# Patient Record
Sex: Male | Born: 1960 | Hispanic: No | Smoking: Former smoker
Health system: Southern US, Community
[De-identification: ages and names within clinical notes are randomized; demographics above are authoritative.]

## PROBLEM LIST (undated history)

## (undated) DIAGNOSIS — C61 Malignant neoplasm of prostate: Secondary | ICD-10-CM

## (undated) DIAGNOSIS — G4730 Sleep apnea, unspecified: Secondary | ICD-10-CM

## (undated) DIAGNOSIS — I10 Essential (primary) hypertension: Secondary | ICD-10-CM

## (undated) DIAGNOSIS — C4490 Unspecified malignant neoplasm of skin, unspecified: Secondary | ICD-10-CM

## (undated) HISTORY — PX: OTHER SURGICAL HISTORY: SHX169

## (undated) HISTORY — PX: PROSTATE BIOPSY: SHX241

## (undated) HISTORY — PX: TONSILLECTOMY: SUR1361

## (undated) HISTORY — DX: Sleep apnea, unspecified: G47.30

---

## 2002-03-29 ENCOUNTER — Encounter: Admission: RE | Admit: 2002-03-29 | Discharge: 2002-03-29 | Payer: Self-pay | Admitting: Otolaryngology

## 2002-03-29 ENCOUNTER — Encounter: Payer: Self-pay | Admitting: Otolaryngology

## 2003-03-07 ENCOUNTER — Ambulatory Visit (HOSPITAL_BASED_OUTPATIENT_CLINIC_OR_DEPARTMENT_OTHER): Admission: RE | Admit: 2003-03-07 | Discharge: 2003-03-07 | Payer: Self-pay | Admitting: Pulmonary Disease

## 2007-01-29 ENCOUNTER — Ambulatory Visit: Payer: Self-pay | Admitting: Pulmonary Disease

## 2011-04-12 ENCOUNTER — Telehealth: Payer: Self-pay | Admitting: Pulmonary Disease

## 2011-04-12 NOTE — Telephone Encounter (Signed)
I spoke with the pt and he states he needs a rx for a new cpap machine. I advised the pt since it has been so long since he was seen he would need an appt to get RX. Pt set for appt on 05-08-11 at 4:00. Carron Curie, CMA

## 2011-05-08 ENCOUNTER — Encounter: Payer: Self-pay | Admitting: Pulmonary Disease

## 2011-05-08 ENCOUNTER — Ambulatory Visit (INDEPENDENT_AMBULATORY_CARE_PROVIDER_SITE_OTHER): Payer: BC Managed Care – PPO | Admitting: Pulmonary Disease

## 2011-05-08 VITALS — BP 152/80 | HR 66 | Temp 98.1°F | Ht 74.0 in | Wt 220.6 lb

## 2011-05-08 DIAGNOSIS — G4733 Obstructive sleep apnea (adult) (pediatric): Secondary | ICD-10-CM | POA: Insufficient documentation

## 2011-05-08 NOTE — Progress Notes (Signed)
  Subjective:    Patient ID: Benjamin Fletcher, male    DOB: 04-11-1961, 50 y.o.   MRN: [id number]  HPI The patient is a 50 year old male who comes in today to reestablish for management of his known sleep disordered breathing.  He has a diagnosis of moderate sleep apnea, but has not been seen since 2008.  He is been wearing CPAP on a compliant basis, and tells me that it is on the automatic mode.  He is using a full face mask as well as a heat humidifier.  Patient states that he is sleeping very well through the night, and feels rested in the mornings upon arising.  No one has commented on breakthrough snoring.  He feels that he is adequate alertness or in the day, and no sleepiness with driving.  His weight is neutral since his last visit.  His main desire for coming in today is to get a prescription for new mask and supplies.  Review of Systems  Constitutional: Negative.  Negative for fever and unexpected weight change.  HENT: Negative.  Negative for ear pain, nosebleeds, congestion, sore throat, rhinorrhea, sneezing, trouble swallowing, dental problem, postnasal drip and sinus pressure.   Eyes: Negative.  Negative for redness and itching.  Respiratory: Negative.  Negative for cough, chest tightness, shortness of breath and wheezing.   Cardiovascular: Negative.  Negative for palpitations and leg swelling.  Gastrointestinal: Negative.  Negative for nausea and vomiting.  Genitourinary: Negative.  Negative for dysuria.  Musculoskeletal: Negative.  Negative for joint swelling.  Skin: Negative.  Negative for rash.  Neurological: Negative.  Negative for headaches.  Hematological: Negative.  Does not bruise/bleed easily.  Psychiatric/Behavioral: Negative.  Negative for dysphoric mood. The patient is not nervous/anxious.        Objective:   Physical Exam Overweight male in no acute distress Nares patent without discharge.  No skin breakdown or pressure necrosis from CPAP mask Chest totally clear  to auscultation Cardiac with regular rate and rhythm Lower extremities without edema, no cyanosis noted Alert and oriented, does not appear sleepy, moves all 4 extremities.       Assessment & Plan:

## 2011-05-08 NOTE — Patient Instructions (Signed)
Will get you a prescription for new mask Work on modest weight loss followup with me in one year.

## 2011-05-09 ENCOUNTER — Encounter: Payer: Self-pay | Admitting: Pulmonary Disease

## 2011-05-12 ENCOUNTER — Encounter: Payer: Self-pay | Admitting: Pulmonary Disease

## 2011-05-12 NOTE — Assessment & Plan Note (Signed)
The patient has known moderate obstructive sleep apnea which appears to be well controlled on his current CPAP.  The patient needs new supplies and mask, have also encouraged him to work aggressively on weight loss.  Have also stressed to him the importance of yearly followup with a sleep professional.  Will send an order to his medical equipment company to arrange for new supplies and a mask.  He will followup with me again in one year.

## 2015-12-21 ENCOUNTER — Ambulatory Visit (INDEPENDENT_AMBULATORY_CARE_PROVIDER_SITE_OTHER): Payer: BLUE CROSS/BLUE SHIELD | Admitting: Family Medicine

## 2015-12-21 VITALS — BP 108/108 | HR 69 | Temp 98.8°F | Resp 16 | Ht 75.0 in | Wt 244.0 lb

## 2015-12-21 DIAGNOSIS — I10 Essential (primary) hypertension: Secondary | ICD-10-CM | POA: Diagnosis not present

## 2015-12-21 DIAGNOSIS — Z0000 Encounter for general adult medical examination without abnormal findings: Secondary | ICD-10-CM

## 2015-12-21 LAB — POCT CBC
Granulocyte percent: 75.7 %G (ref 37–80)
HCT, POC: 47.9 % (ref 43.5–53.7)
Hemoglobin: 17.4 g/dL (ref 14.1–18.1)
Lymph, poc: 1.8 (ref 0.6–3.4)
MCH, POC: 33.7 pg — AB (ref 27–31.2)
MCHC: 36.3 g/dL — AB (ref 31.8–35.4)
MCV: 92.8 fL (ref 80–97)
MID (cbc): 0.6 (ref 0–0.9)
MPV: 8.2 fL (ref 0–99.8)
POC Granulocyte: 7.4 — AB (ref 2–6.9)
POC LYMPH PERCENT: 18 %L (ref 10–50)
POC MID %: 6.3 %M (ref 0–12)
Platelet Count, POC: 168 10*3/uL (ref 142–424)
RBC: 5.16 M/uL (ref 4.69–6.13)
RDW, POC: 12.8 %
WBC: 9.8 10*3/uL (ref 4.6–10.2)

## 2015-12-21 LAB — COMPREHENSIVE METABOLIC PANEL
ALT: 47 U/L — ABNORMAL HIGH (ref 9–46)
AST: 14 U/L (ref 10–35)
Albumin: 4.4 g/dL (ref 3.6–5.1)
Alkaline Phosphatase: 65 U/L (ref 40–115)
BUN: 8 mg/dL (ref 7–25)
CO2: 24 mmol/L (ref 20–31)
Calcium: 9.5 mg/dL (ref 8.6–10.3)
Chloride: 105 mmol/L (ref 98–110)
Creat: 0.97 mg/dL (ref 0.70–1.33)
Glucose, Bld: 105 mg/dL — ABNORMAL HIGH (ref 65–99)
Potassium: 4 mmol/L (ref 3.5–5.3)
Sodium: 142 mmol/L (ref 135–146)
Total Bilirubin: 0.9 mg/dL (ref 0.2–1.2)
Total Protein: 6.8 g/dL (ref 6.1–8.1)

## 2015-12-21 LAB — LIPID PANEL
Cholesterol: 234 mg/dL — ABNORMAL HIGH (ref 125–200)
HDL: 50 mg/dL (ref >=40)
LDL Cholesterol: 138 mg/dL — ABNORMAL HIGH (ref <130)
Total CHOL/HDL Ratio: 4.7 Ratio (ref <=5.0)
Triglycerides: 230 mg/dL — ABNORMAL HIGH (ref <150)
VLDL: 46 mg/dL — ABNORMAL HIGH (ref <30)

## 2015-12-21 LAB — POCT URINALYSIS DIP (MANUAL ENTRY)
Bilirubin, UA: NEGATIVE
Blood, UA: NEGATIVE
Glucose, UA: NEGATIVE
Ketones, POC UA: NEGATIVE
Leukocytes, UA: NEGATIVE
Nitrite, UA: NEGATIVE
Protein Ur, POC: NEGATIVE
Spec Grav, UA: 1.015
Urobilinogen, UA: 0.2
pH, UA: 7

## 2015-12-21 MED ORDER — LOSARTAN POTASSIUM 100 MG PO TABS: 100.0000 mg | ORAL_TABLET | Freq: Every day | ORAL | Status: DC

## 2015-12-21 NOTE — Patient Instructions (Addendum)

## 2015-12-21 NOTE — Progress Notes (Signed)
Patient ID: Benjamin Fletcher MRN: [id number], DOB: [birth year] 55 y.o. Date of Encounter: 12/21/2015, 1:54 PM  Primary Physician: No primary care provider on file.  Chief Complaint: Physical (CPE)  HPI: 55 y.o. y/o male with history noted below here for CPE.  Doing well. No issues/complaints.  Uses CPAP machine regularly H/O facial skin cancers removed Had eyes checked 54 months ago Will be seeing Urologist next week  His brother has renal cell and prostate ca.  Another brother has diabetes.  Review of Systems: Consitutional: No fever, chills, fatigue, night sweats, lymphadenopathy, or weight changes. Eyes: No visual changes, eye redness, or discharge. ENT/Mouth: Ears: No otalgia, tinnitus, hearing loss, discharge. Nose: No congestion, rhinorrhea, sinus pain, or epistaxis. Throat: No sore throat, post nasal drip, or teeth pain. Cardiovascular: No CP, palpitations, diaphoresis, DOE, edema, orthopnea, PND. Respiratory: No cough, hemoptysis, SOB, or wheezing. Gastrointestinal: No anorexia, dysphagia, reflux, pain, nausea, vomiting, hematemesis, diarrhea, constipation, BRBPR, or melena. Genitourinary: No dysuria, frequency, urgency, hematuria, incontinence, nocturia, decreased urinary stream, discharge, impotence, or testicular pain/masses. Musculoskeletal: No decreased ROM, myalgias, stiffness, joint swelling, or weakness. Skin: No rash, erythema, lesion changes, pain, warmth, jaundice, or pruritis. Neurological: No headache, dizziness, syncope, seizures, tremors, memory loss, coordination problems, or paresthesias. Psychological: No anxiety, depression, hallucinations, SI/HI. Endocrine: No fatigue, polydipsia, polyphagia, polyuria, or known diabetes. All other systems were reviewed and are otherwise negative.  Past Medical History  Diagnosis Date  . Sleep apnea      History reviewed. No pertinent past surgical history.  Home Meds:  Prior to Admission medications   Not on File      Allergies: No Known Allergies  Social History   Social History  . Marital Status: Single    Spouse Name: N/A  . Number of Children: N/A  . Years of Education: N/A   Occupational History  . HVAC    Social History Main Topics  . Smoking status: Never Smoker   . Smokeless tobacco: Not on file  . Alcohol Use: Yes     Comment: weekends only  . Drug Use: No  . Sexual Activity: Not on file   Other Topics Concern  . Not on file   Social History Narrative    Family History  Problem Relation Age of Onset  . Heart disease Father     CABG    Physical Exam: Blood pressure 108/108, pulse 69, temperature 98.8 F (37.1 C), resp. rate 16, height 6\' 3"  (1.905 m), weight 244 lb (110.678 kg), SpO2 97 %.  BP Readings from Last 3 Encounters:  12/21/15 108/108  05/08/11 152/80  BP recheck 136/100 General: Well developed, well nourished, in no acute distress. HEENT: Normocephalic, atraumatic. Conjunctiva pink, sclera non-icteric. Pupils 2 mm constricting to 1 mm, round, regular, and equally reactive to light and accomodation. EOMI.  Fundi benign   Internal auditory canal clear. TMs with good cone of light and without pathology. Nasal mucosa pink. Nares are without discharge. No sinus tenderness. Oral mucosa pink. Dentition excellent.  He has a Pharmacist, community. Pharynx without exudate.    Neck: Supple. Trachea midline. No thyromegaly. Full ROM. No lymphadenopathy. Lungs: Clear to auscultation bilaterally without wheezes, rales, or rhonchi. Breathing is of normal effort and unlabored. Cardiovascular: RRR with S1 S2. No murmurs, rubs, or gallops appreciated. Distal pulses 2+ symmetrically. No carotid or abdominal bruits Abdomen: Soft, non-tender, non-distended with normoactive bowel sounds. No hepatosplenomegaly or masses. No rebound/guarding. No CVA tenderness. Without hernias.  Musculoskeletal: Full range of motion and  5/5 strength throughout. Without swelling, atrophy, tenderness, crepitus, or  warmth. Extremities without clubbing, cyanosis, or edema. Calves supple. Skin: Warm and moist without erythema, ecchymosis, wounds, or rash. Neuro: A+Ox3. CN II-XII grossly intact. Moves all extremities spontaneously. Full sensation throughout. Normal gait. DTR 2+ throughout upper and lower extremities. Finger to nose intact. Psych:  Responds to questions appropriately with a normal affect.    Assessment/Plan:  55 y.o. y/o  male here for CPE   ICD-9-CM ICD-10-CM   1. Annual physical exam V70.0 Z00.00 POCT CBC     POCT urinalysis dipstick     Comprehensive metabolic panel     Lipid panel     EKG 12-Lead     Ambulatory referral to Gastroenterology  2. Essential hypertension 401.9 I10 losartan (COZAAR) 100 MG tablet    Signed, Robyn Haber, MD 12/21/2015 1:54 PM

## 2016-03-04 ENCOUNTER — Encounter: Payer: Self-pay | Admitting: Radiation Oncology

## 2016-03-04 ENCOUNTER — Ambulatory Visit
Admission: RE | Admit: 2016-03-04 | Discharge: 2016-03-04 | Disposition: A | Discharge status: Home / Self Care | Payer: BLUE CROSS/BLUE SHIELD | Source: Ambulatory Visit | Attending: Radiation Oncology | Admitting: Radiation Oncology

## 2016-03-04 VITALS — BP 169/88 | HR 71 | Resp 16 | Ht 74.0 in | Wt 248.2 lb

## 2016-03-04 DIAGNOSIS — C61 Malignant neoplasm of prostate: Secondary | ICD-10-CM | POA: Insufficient documentation

## 2016-03-04 HISTORY — DX: Malignant neoplasm of prostate: C61

## 2016-03-04 HISTORY — DX: Essential (primary) hypertension: I10

## 2016-03-04 HISTORY — DX: Unspecified malignant neoplasm of skin, unspecified: C44.90

## 2016-03-04 NOTE — Progress Notes (Signed)
See progress note under physician encounter. 

## 2016-03-04 NOTE — Progress Notes (Signed)
GU Location of Tumor / Histology: prostatic adenocarcinoma  If Prostate Cancer, Gleason Score is (3 + 3) and PSA is (3.32) on 01/08/16  Benjamin Fletcher presented to Dr. Jeffie Pollock for evaluation because "his mother made him" due to his brother's advanced disease.   Biopsies of prostate (if applicable) revealed:    Past/Anticipated interventions by urology, if any: biopsy referral to Dr. Tammi Klippel  Past/Anticipated interventions by medical oncology, if any: no  Weight changes, if any: no  Bowel/Bladder complaints, if any:  Nocturia x1. Urgency and frequency less than 1 in 5 times. Denies ED, dysuria, or hematuria. Denies leakage or incontinence.  Nausea/Vomiting, if any: no  Pain issues, if any:  no  SAFETY ISSUES:  Prior radiation? no  Pacemaker/ICD? no  Possible current pregnancy? no  Is the patient on methotrexate? no  Current Complaints / other details:  55 year old male. Prostate volume: 40 cc. NKDA. Single. One son and one daughter. Occupation:HVAC. Brother and father both had prostate cancer. Dad had cryo, got an infection and ended up having a testicle removed. His brother had ADT and completed radiation therapy today.

## 2016-03-04 NOTE — Progress Notes (Signed)
Radiation Oncology         [phone] ________________________________  Initial Outpatient Consultation  Name: Benjamin Fletcher MRN: [id number]  Date: 03/04/2016  DOB: 10-Oct-1960  CC:No primary care provider on file.  Irine Seal, MD   REFERRING PHYSICIAN: Irine Seal, MD  DIAGNOSIS: 55 y.o. gentleman with adenocarcinoma of the prostate with a Gleason's score of 3+3 and a PSA of 3.32    ICD-9-CM ICD-10-CM   1. Malignant neoplasm of prostate ([place]) 185 C61     HISTORY OF PRESENT ILLNESS::Benjamin Fletcher is a 55 y.o. gentleman who has a history of BPH. When his brother was diagnosed with prostate cancer, he wanted to be screened. He was referred to Dr. Jeffie Pollock on 12/25/2015. He had an elevated PSA 12/25/2015 at 3.52 and had another PSA 01/08/2016 that was 3.32. Digital rectal examination was performed at that time revealing his prostate size was 2+ and had no palpable masses. The patient proceeded to transrectal ultrasound with 12 biopsies of the prostate on 01/26/2016.  The prostate volume measured 40 cc.  Out of 12 core biopsies, two were positive.  The maximum Gleason score was 3+3, and this was seen in the left mid lateral and the right apex, shown in the distribution below.   The patient reviewed the biopsy results with his urologist and he has kindly been referred today for discussion of potential radiation treatment options.  PREVIOUS RADIATION THERAPY: No  PAST MEDICAL HISTORY:  has a past medical history of Prostate cancer ([place]); Hypertension; Sleep apnea; and Skin cancer.    PAST SURGICAL HISTORY: Past Surgical History  Procedure Laterality Date  . Prostate biopsy    . Tonsillectomy    . Skin cancer excised x3      FAMILY HISTORY: family history includes Cancer in his brother and father; Heart disease in his father.   His father and brother both have prostate cancer. His father was treated with cryotherapy. His brother, age 43, was treated with ADT and radiation with ADT for  Gleason 8 disease at age 62.  SOCIAL HISTORY:  reports that he has never smoked. He does not have any smokeless tobacco history on file. He reports that he drinks alcohol. He reports that he does not use illicit drugs.  ALLERGIES: Review of patient's allergies indicates no known allergies.  MEDICATIONS:  Current Outpatient Prescriptions  Medication Sig Dispense Refill  . losartan (COZAAR) 100 MG tablet Take 1 tablet (100 mg total) by mouth daily. 90 tablet 3  . aspirin 81 MG tablet Take 81 mg by mouth daily. Reported on 03/04/2016     No current facility-administered medications for this encounter.    REVIEW OF SYSTEMS:  A 15 point review of systems is documented in the electronic medical record. This was obtained by the nursing staff. However, I reviewed this with the patient to discuss relevant findings and make appropriate changes.  Pertinent items are noted in HPI..  The patient completed an IPSS and IIEF questionnaire.  His IPSS score was 3 indicating mild urinary outflow obstructive symptoms.  He indicated that his erectile function is able to complete sexual activity almost always.  He denies any weight changes. He mentions he has nocturia x 1. He mentions he has urgency and frequency less than 1 in 5 times. He denies erectile dysfunction, dysuria, or hematuria. He denies leakage or incontinence. He denies nausea or vomiting. He denies any pain.   PHYSICAL EXAM: This patient is in no acute distress.  He is  alert and oriented.   height is 6\' 2"  (1.88 m) and weight is 248 lb 3.2 oz (112.583 kg). His blood pressure is 169/88 and his pulse is 71. His respiration is 16 and oxygen saturation is 100%.  He exhibits no respiratory distress or labored breathing.  He appears neurologically intact.  His mood is pleasant.  His affect is appropriate.  Please note the digital rectal exam findings described above.  KPS = 100  100 - Normal; no complaints; no evidence of disease. 90   - Able to carry on  normal activity; minor signs or symptoms of disease. 80   - Normal activity with effort; some signs or symptoms of disease. 51   - Cares for self; unable to carry on normal activity or to do active work. 60   - Requires occasional assistance, but is able to care for most of his personal needs. 50   - Requires considerable assistance and frequent medical care. 32   - Disabled; requires special care and assistance. 32   - Severely disabled; hospital admission is indicated although death not imminent. 25   - Very sick; hospital admission necessary; active supportive treatment necessary. 10   - Moribund; fatal processes progressing rapidly. 0     - Dead  Karnofsky DA, Abelmann [place], Craver LS and Burchenal [facility] [phone]) The use of the nitrogen mustards in the palliative treatment of carcinoma: with particular reference to bronchogenic carcinoma Cancer 1 634-56   LABORATORY DATA:  Lab Results  Component Value Date   WBC 9.8 12/21/2015   HGB 17.4 12/21/2015   HCT 47.9 12/21/2015   MCV 92.8 12/21/2015   Lab Results  Component Value Date   NA 142 12/21/2015   K 4.0 12/21/2015   CL 105 12/21/2015   CO2 24 12/21/2015   Lab Results  Component Value Date   ALT 47* 12/21/2015   AST 14 12/21/2015   ALKPHOS 65 12/21/2015   BILITOT 0.9 12/21/2015     RADIOGRAPHY: No results found.    IMPRESSION: This gentleman is a 55 yo with adenocarcinoma of the prostate with a Gleason score of 3+3 and PSA of 3.32.  His T-Stage, Gleason's Score, and PSA put him into the low risk group.  Accordingly he is eligible for a variety of potential treatment options including surgery, seed implant, external beam radiotherapy, or active surveillance.   PLAN: Today I reviewed the findings and workup thus far.  We discussed the natural history of prostate cancer.  We reviewed the the implications of T-stage, Gleason's Score, and PSA on decision-making and outcomes in prostate cancer.  We discussed radiation treatment in the  management of prostate cancer with regard to the logistics and delivery of external beam radiation treatment as well as the logistics and delivery of prostate brachytherapy.  We compared and contrasted each of these approaches and also compared these against prostatectomy.   He would like to initially track PSA consistent with active surveillance at this time. He will continue to follow Dr. Jeffie Pollock and get his PSA checked.   He is also interested in brachytherapy in the future if he decides to pursue treatment.   I spent 40 minutes face to face with the patient and more than 50% of that time was spent in counseling and/or coordination of care.  ------------------------------------------------  Sheral Apley. Tammi Klippel, M.D.    This document serves as a record of services personally performed by Tyler Pita, MD. It was created on his behalf by Lendon Collar,  a trained medical scribe. The creation of this record is based on the scribe's personal observations and the provider's statements to them. This document has been checked and approved by the attending provider.

## 2016-12-01 ENCOUNTER — Other Ambulatory Visit: Payer: Self-pay | Admitting: Family Medicine

## 2016-12-01 DIAGNOSIS — I10 Essential (primary) hypertension: Secondary | ICD-10-CM

## 2016-12-02 NOTE — Telephone Encounter (Signed)
Please schedule an appt with the patient's chosen PCP within 3 months.

## 2016-12-03 NOTE — Telephone Encounter (Signed)
MyChart message sent to patient about making an appointment for medication refills per Windell Hummingbird.

## 2017-02-26 ENCOUNTER — Other Ambulatory Visit: Payer: Self-pay | Admitting: Physician Assistant

## 2017-02-26 DIAGNOSIS — I10 Essential (primary) hypertension: Secondary | ICD-10-CM

## 2017-03-17 ENCOUNTER — Other Ambulatory Visit: Payer: Self-pay | Admitting: Family Medicine

## 2017-03-17 DIAGNOSIS — I10 Essential (primary) hypertension: Secondary | ICD-10-CM

## 2017-03-17 MED ORDER — LOSARTAN POTASSIUM 100 MG PO TABS: 100.0000 mg | ORAL_TABLET | Freq: Every day | ORAL | 3 refills | Status: DC

## 2017-06-30 ENCOUNTER — Ambulatory Visit (INDEPENDENT_AMBULATORY_CARE_PROVIDER_SITE_OTHER): Payer: Self-pay | Admitting: Family Medicine

## 2017-06-30 DIAGNOSIS — Z23 Encounter for immunization: Secondary | ICD-10-CM

## 2017-10-30 ENCOUNTER — Other Ambulatory Visit: Payer: Self-pay | Admitting: Urology

## 2017-10-30 DIAGNOSIS — C61 Malignant neoplasm of prostate: Secondary | ICD-10-CM

## 2017-11-25 ENCOUNTER — Other Ambulatory Visit: Payer: Self-pay | Admitting: Urology

## 2017-11-25 ENCOUNTER — Ambulatory Visit
Admission: RE | Admit: 2017-11-25 | Discharge: 2017-11-25 | Disposition: A | Discharge status: Home / Self Care | Payer: BLUE CROSS/BLUE SHIELD | Source: Ambulatory Visit | Attending: Urology | Admitting: Urology

## 2017-11-25 DIAGNOSIS — S40859A Superficial foreign body of unspecified upper arm, initial encounter: Secondary | ICD-10-CM

## 2017-11-25 DIAGNOSIS — C61 Malignant neoplasm of prostate: Secondary | ICD-10-CM

## 2017-11-25 MED ORDER — GADOBENATE DIMEGLUMINE 529 MG/ML IV SOLN
20.0000 mL | Freq: Once | INTRAVENOUS | Status: AC | PRN
  Administered 2017-11-25: 20 mL via INTRAVENOUS

## 2017-11-28 NOTE — Progress Notes (Unsigned)
error 

## 2018-07-13 ENCOUNTER — Ambulatory Visit: Payer: Self-pay | Admitting: Nurse Practitioner

## 2018-07-13 DIAGNOSIS — Z23 Encounter for immunization: Secondary | ICD-10-CM

## 2018-07-13 NOTE — Progress Notes (Signed)
Pt presents here today for visit to receive right deltoid vaccine. Allergies reviewed, vaccine given, vaccine information statement provided, tolerated well.

## 2018-10-20 ENCOUNTER — Encounter (HOSPITAL_COMMUNITY): Payer: Self-pay | Admitting: Emergency Medicine

## 2018-10-20 ENCOUNTER — Ambulatory Visit (HOSPITAL_COMMUNITY)
Admission: EM | Admit: 2018-10-20 | Discharge: 2018-10-20 | Disposition: A | Discharge status: Home / Self Care | Payer: BLUE CROSS/BLUE SHIELD | Attending: Family Medicine | Admitting: Family Medicine

## 2018-10-20 DIAGNOSIS — J0100 Acute maxillary sinusitis, unspecified: Secondary | ICD-10-CM | POA: Diagnosis not present

## 2018-10-20 MED ORDER — CEFDINIR 300 MG PO CAPS: 600.0000 mg | ORAL_CAPSULE | Freq: Every day | ORAL | 0 refills | Status: DC

## 2018-10-20 NOTE — ED Provider Notes (Signed)
[facility]    CSN: [id number] Arrival date & time: 10/20/18  1220     History   Chief Complaint Chief Complaint  Patient presents with  . URI    appt 0102    HPI Benjamin Fletcher is a 58 y.o. male.   This is the initial [facility] urgent care visit for this 58 year old man is complaining of ear pain and congestion.  His problem began in the left nasal area and migrated to the left cheek.  He has had some epistaxis.  His ear started to get blocked on the left.  Patient works in [employer].  He has had no fever.     Past Medical History:  Diagnosis Date  . Hypertension   . Prostate cancer ([place])   . 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 Sleep apnea     Patient Active Problem List   Diagnosis Date Noted  . Malignant neoplasm of prostate ([place]) 03/04/2016  . OSA (obstructive sleep apnea) 05/08/2011    Past Surgical History:  Procedure Laterality Date  . PROSTATE BIOPSY    . skin cancer excised x3    . TONSILLECTOMY         Home Medications    Prior to Admission medications   Medication Sig Start Date End Date Taking? Authorizing Provider  aspirin 81 MG tablet Take 81 mg by mouth daily. Reported on 03/04/2016    [provider]  cefdinir (OMNICEF) 300 MG capsule Take 2 capsules (600 mg total) by mouth daily. 10/20/18   Robyn Haber, MD  losartan (COZAAR) 100 MG tablet Take 1 tablet (100 mg total) by mouth daily. 03/17/17   Robyn Haber, MD    Family History Family History  Problem Relation Age of Onset  . Heart disease Father        CABG  . Cancer Father        prostate  . Cancer Brother        kidney and prostate    Social History Social History   Tobacco Use  . Smoking status: Never Smoker  Substance Use Topics  . Alcohol use: Yes    Comment: weekends only  . Drug use: No     Allergies   Patient has no known allergies.   Review  of Systems Review of Systems   Physical Exam Triage Vital Signs ED Triage Vitals  Enc Vitals Group     BP 10/20/18 1239 (!) 183/91     Pulse Rate 10/20/18 1239 84     Resp 10/20/18 1239 18     Temp 10/20/18 1239 98 F (36.7 C)     Temp Source 10/20/18 1239 Oral     SpO2 10/20/18 1239 96 %     Weight --      Height --      Head Circumference --      Peak Flow --      Pain Score 10/20/18 1240 5     Pain Loc --      Pain Edu? --      Excl. in [place]? --    No data found.  Updated Vital Signs BP (!) 183/91 (BP Location: Left Arm)   Pulse 84   Temp 98 F (36.7 C) (Oral)   Resp 18   SpO2 96%    Physical Exam Vitals signs and nursing note reviewed.  Constitutional:  Appearance: Normal appearance.  HENT:     Head: Normocephalic.     Right Ear: Tympanic membrane and external ear normal.     Left Ear: Tympanic membrane and external ear normal.     Nose: Congestion present.     Mouth/Throat:     Mouth: Mucous membranes are moist.     Pharynx: Oropharynx is clear.  Eyes:     Conjunctiva/sclera: Conjunctivae normal.  Neck:     Musculoskeletal: Normal range of motion.  Pulmonary:     Effort: Pulmonary effort is normal.  Musculoskeletal: Normal range of motion.  Skin:    General: Skin is warm and dry.  Neurological:     General: No focal deficit present.     Mental Status: He is alert and oriented to person, place, and time.     Gait: Gait normal.  Psychiatric:        Mood and Affect: Mood normal.      UC Treatments / Results  Labs (all labs ordered are listed, but only abnormal results are displayed) Labs Reviewed - No data to display  EKG None  Radiology No results found.  Procedures Procedures (including critical care time)  Medications Ordered in UC Medications - No data to display  Initial Impression / Assessment and Plan / UC Course  I have reviewed the triage vital signs and the nursing notes.  Pertinent labs & imaging results that were  available during my care of the patient were reviewed by me and considered in my medical decision making (see chart for details).    Final Clinical Impressions(s) / UC Diagnoses   Final diagnoses:  Acute non-recurrent maxillary sinusitis   Discharge Instructions   None    ED Prescriptions    Medication Sig Dispense Auth. Provider   cefdinir (OMNICEF) 300 MG capsule Take 2 capsules (600 mg total) by mouth daily. 20 capsule Robyn Haber, MD     Controlled Substance Prescriptions [place] Controlled Substance Registry consulted? Not Applicable   Robyn Haber, MD 10/20/18 1249

## 2018-10-20 NOTE — ED Triage Notes (Signed)
Pt sts sinus congestion and left ear pain

## 2019-03-08 IMAGING — MR MR PROSTATE WO/W CM
56 series · 56 of 56 positions shown · IV contrast (Multihance 20cc)
Comparison: None.

CLINICAL DATA: Prostate cancer, low volume positive biopsy (Gleason
score 6) in 0204

EXAM:
MR PROSTATE WITHOUT AND WITH CONTRAST
TECHNIQUE: Multiplanar multisequence MRI images were obtained of the pelvis
centered about the prostate. Pre and post contrast images were
obtained.
CONTRAST:  20mL MULTIHANCE GADOBENATE DIMEGLUMINE 529 MG/ML IV SOLN

[Series 5: T1 · axial · 8.0mm · 1.06mm/px · 1 of 28 slices shown (1 of 2)]
[im 1/28]
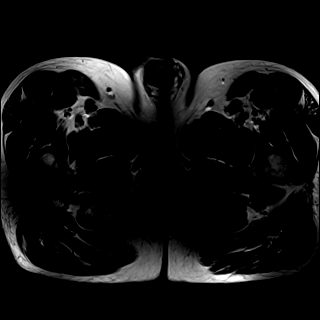

[Series 6: bSSFP fat-sat · axial · 8.0mm · 0.74mm/px · 1 of 28 slices shown]
[im 1/28]
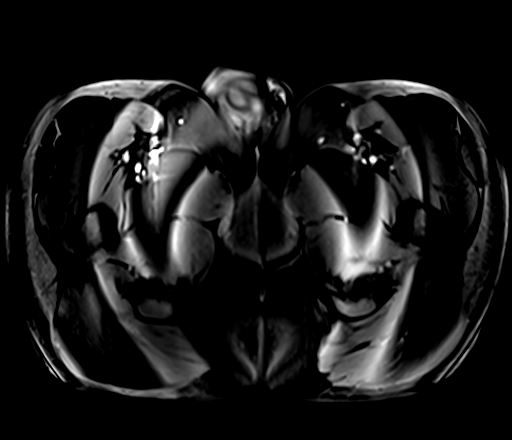

[Series 7: T2 · sagittal · 3.5mm · 0.56mm/px · 1 of 39 slices shown (1 of 4)]
[im 1/39]
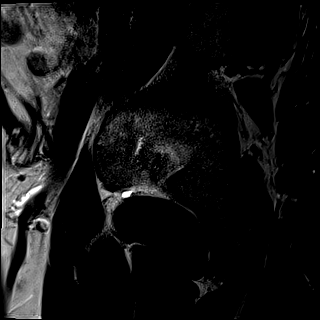

[Series 8: T2 · axial · 3.5mm · 0.56mm/px · 1 of 23 slices shown (2 of 4)]
[im 1/23]
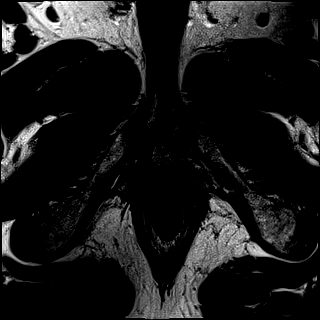

[Series 9: T1 · axial · 3.0mm · 0.31mm/px · 1 of 24 slices shown (2 of 2)]
[im 1/24]
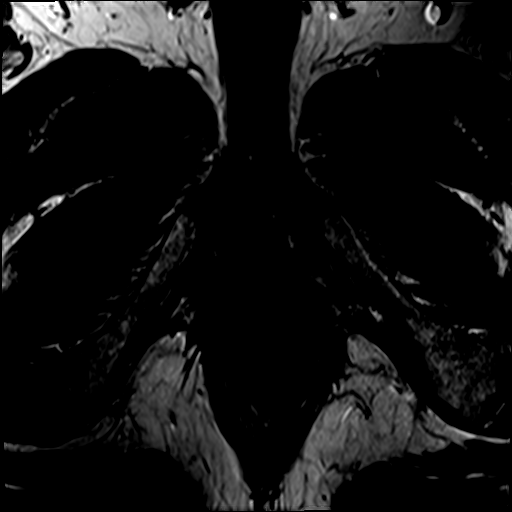

[Series 10: T2 · axial · 1.0mm · 1.04mm/px · 1 of 80 slices shown (3 of 4)]
[im 1/80]
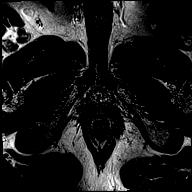

[Series 11: T2 · coronal · 3.5mm · 0.56mm/px · 1 of 23 slices shown (4 of 4)]
[im 1/23]
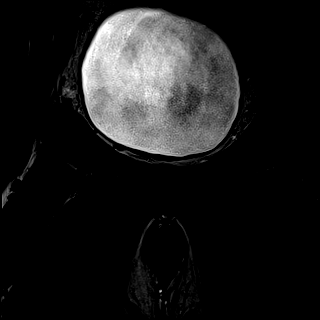

[Series 12: DWI · axial · 3.5mm · 1.56mm/px · 1 of 60 slices shown (1 of 2)]
[im 1/60]
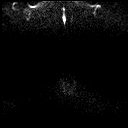

[Series 13: DWI · axial · 3.5mm · 1.56mm/px · 1 of 20 slices shown (2 of 2)]
[im 1/20]
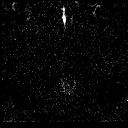

[Series 14: pre t1_twist_tra_dyn_ttc=5.3s · axial · non-contrast · 3.5mm · 0.83mm/px · 1 of 20 slices shown]
[im 1/20]
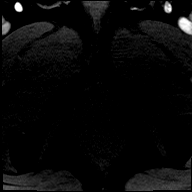

[Series 15: post t1_twist_tra_dyn-copy center · axial · 3.5mm · 0.83mm/px · 1 of 20 slices shown (1 of 24)]
[im 1/20]
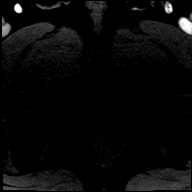

[Series 16: post t1_twist_tra_dyn-copy center · axial · 3.5mm · 0.83mm/px · 1 of 20 slices shown (2 of 24)]
[im 1/20]
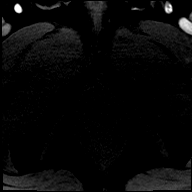

[Series 17: post t1_twist_tra_dyn-copy cent_sub_ttc=(id) · axial · 3.5mm · 0.83mm/px · 1 of 20 slices shown (1 of 22)]
[im 1/20]
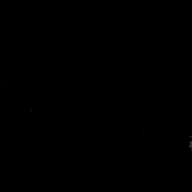

[Series 18: post t1_twist_tra_dyn-copy center · axial · 3.5mm · 0.83mm/px · 1 of 20 slices shown (3 of 24)]
[im 1/20]
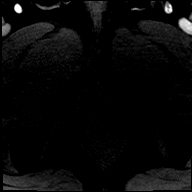

[Series 19: post t1_twist_tra_dyn-copy cent_sub_ttc=(id) · axial · 3.5mm · 0.83mm/px · 1 of 20 slices shown (2 of 22)]
[im 1/20]
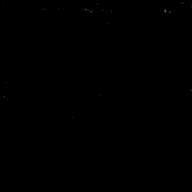

[Series 20: post t1_twist_tra_dyn-copy center · axial · 3.5mm · 0.83mm/px · 1 of 20 slices shown (4 of 24)]
[im 1/20]
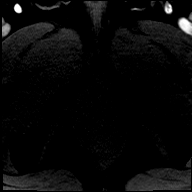

[Series 21: post t1_twist_tra_dyn-copy cent_sub_ttc=(id) · axial · 3.5mm · 0.83mm/px · 1 of 20 slices shown (3 of 22)]
[im 1/20]
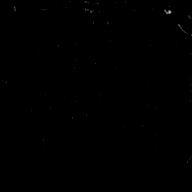

[Series 22: post t1_twist_tra_dyn-copy center · axial · 3.5mm · 0.83mm/px · 1 of 20 slices shown (5 of 24)]
[im 1/20]
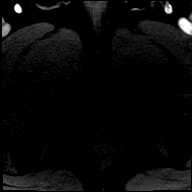

[Series 23: post t1_twist_tra_dyn-copy cent_sub_ttc=(id) · axial · 3.5mm · 0.83mm/px · 1 of 20 slices shown (4 of 22)]
[im 1/20]
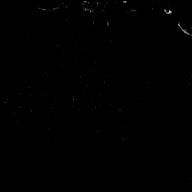

[Series 24: post t1_twist_tra_dyn-copy center · axial · 3.5mm · 0.83mm/px · 1 of 20 slices shown (6 of 24)]
[im 1/20]
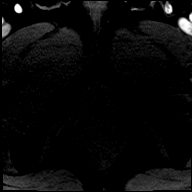

[Series 25: post t1_twist_tra_dyn-copy cent_sub_ttc=(id) · axial · 3.5mm · 0.83mm/px · 1 of 20 slices shown (5 of 22)]
[im 1/20]
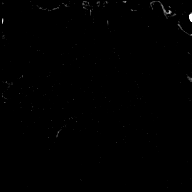

[Series 26: post t1_twist_tra_dyn-copy center · axial · 3.5mm · 0.83mm/px · 1 of 20 slices shown (7 of 24)]
[im 1/20]
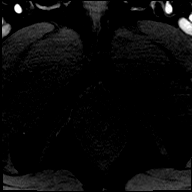

[Series 27: post t1_twist_tra_dyn-copy cent_sub_ttc=(id) · axial · 3.5mm · 0.83mm/px · 1 of 20 slices shown (6 of 22)]
[im 1/20]
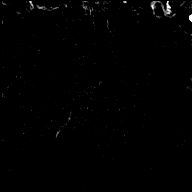

[Series 28: post t1_twist_tra_dyn-copy center · axial · 3.5mm · 0.83mm/px · 1 of 20 slices shown (8 of 24)]
[im 1/20]
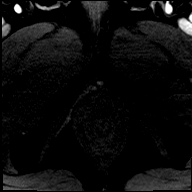

[Series 29: post t1_twist_tra_dyn-copy cent_sub_ttc=(id) · axial · 3.5mm · 0.83mm/px · 1 of 20 slices shown (7 of 22)]
[im 1/20]
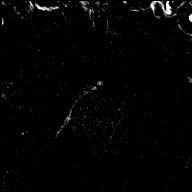

[Series 30: post t1_twist_tra_dyn-copy center · axial · 3.5mm · 0.83mm/px · 1 of 20 slices shown (9 of 24)]
[im 1/20]
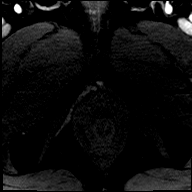

[Series 31: post t1_twist_tra_dyn-copy cent_sub_ttc=(id) · axial · 3.5mm · 0.83mm/px · 1 of 20 slices shown (8 of 22)]
[im 1/20]
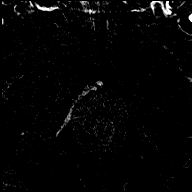

[Series 32: post t1_twist_tra_dyn-copy center · axial · 3.5mm · 0.83mm/px · 1 of 20 slices shown (10 of 24)]
[im 1/20]
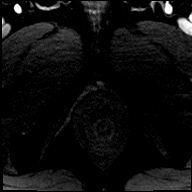

[Series 33: post t1_twist_tra_dyn-copy cent_sub_ttc=(id) · axial · 3.5mm · 0.83mm/px · 1 of 20 slices shown (9 of 22)]
[im 1/20]
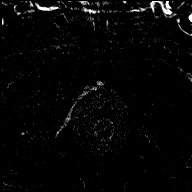

[Series 34: post t1_twist_tra_dyn-copy center · axial · 3.5mm · 0.83mm/px · 1 of 20 slices shown (11 of 24)]
[im 1/20]
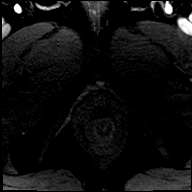

[Series 35: post t1_twist_tra_dyn-copy cent_sub_ttc=(id) · axial · 3.5mm · 0.83mm/px · 1 of 20 slices shown (10 of 22)]
[im 1/20]
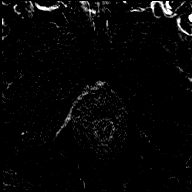

[Series 36: post t1_twist_tra_dyn-copy center · axial · 3.5mm · 0.83mm/px · 1 of 20 slices shown (12 of 24)]
[im 1/20]
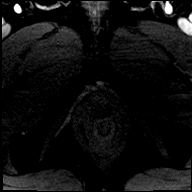

[Series 37: post t1_twist_tra_dyn-copy cent_sub_ttc=(id) · axial · 3.5mm · 0.83mm/px · 1 of 20 slices shown (11 of 22)]
[im 1/20]
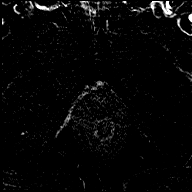

[Series 38: post t1_twist_tra_dyn-copy center · axial · 3.5mm · 0.83mm/px · 1 of 20 slices shown (13 of 24)]
[im 1/20]
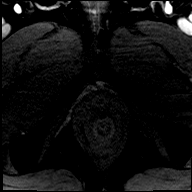

[Series 39: post t1_twist_tra_dyn-copy cent_sub_ttc=(id) · axial · 3.5mm · 0.83mm/px · 1 of 20 slices shown (12 of 22)]
[im 1/20]
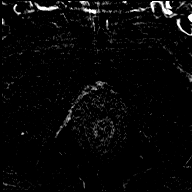

[Series 40: post t1_twist_tra_dyn-copy center · axial · 3.5mm · 0.83mm/px · 1 of 20 slices shown (14 of 24)]
[im 1/20]
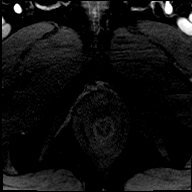

[Series 41: post t1_twist_tra_dyn-copy cent_sub_ttc=(id) · axial · 3.5mm · 0.83mm/px · 1 of 20 slices shown (13 of 22)]
[im 1/20]
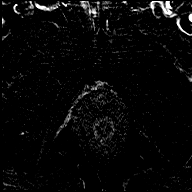

[Series 42: post t1_twist_tra_dyn-copy center · axial · 3.5mm · 0.83mm/px · 1 of 20 slices shown (15 of 24)]
[im 1/20]
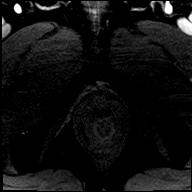

[Series 43: post t1_twist_tra_dyn-copy cent_sub_ttc=(id) · axial · 3.5mm · 0.83mm/px · 1 of 20 slices shown (14 of 22)]
[im 1/20]
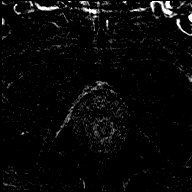

[Series 44: post t1_twist_tra_dyn-copy center · axial · 3.5mm · 0.83mm/px · 1 of 20 slices shown (16 of 24)]
[im 1/20]
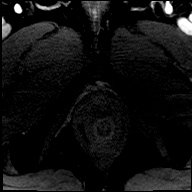

[Series 45: post t1_twist_tra_dyn-copy cent_sub_ttc=(id) · axial · 3.5mm · 0.83mm/px · 1 of 20 slices shown (15 of 22)]
[im 1/20]
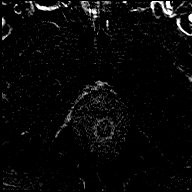

[Series 46: post t1_twist_tra_dyn-copy center · axial · 3.5mm · 0.83mm/px · 1 of 20 slices shown (17 of 24)]
[im 1/20]
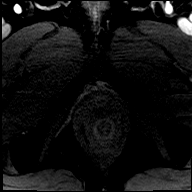

[Series 47: post t1_twist_tra_dyn-copy cent_sub_ttc=(id) · axial · 3.5mm · 0.83mm/px · 1 of 20 slices shown (16 of 22)]
[im 1/20]
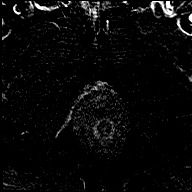

[Series 48: post t1_twist_tra_dyn-copy center · axial · 3.5mm · 0.83mm/px · 1 of 20 slices shown (18 of 24)]
[im 1/20]
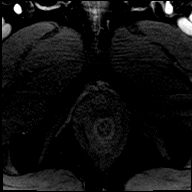

[Series 49: post t1_twist_tra_dyn-copy cent_sub_ttc=(id) · axial · 3.5mm · 0.83mm/px · 1 of 20 slices shown (17 of 22)]
[im 1/20]
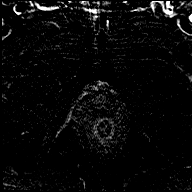

[Series 50: post t1_twist_tra_dyn-copy center · axial · 3.5mm · 0.83mm/px · 1 of 20 slices shown (19 of 24)]
[im 1/20]
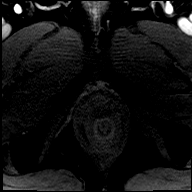

[Series 51: post t1_twist_tra_dyn-copy cent_sub_ttc=(id) · axial · 3.5mm · 0.83mm/px · 1 of 20 slices shown (18 of 22)]
[im 1/20]
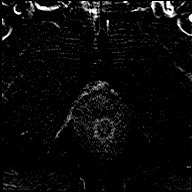

[Series 52: post t1_twist_tra_dyn-copy center · axial · 3.5mm · 0.83mm/px · 1 of 20 slices shown (20 of 24)]
[im 1/20]
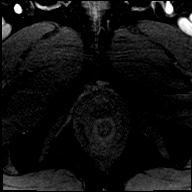

[Series 53: post t1_twist_tra_dyn-copy cent_sub_ttc=(id) · axial · 3.5mm · 0.83mm/px · 1 of 20 slices shown (19 of 22)]
[im 1/20]
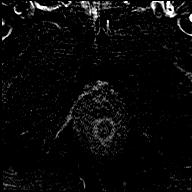

[Series 54: post t1_twist_tra_dyn-copy center · axial · 3.5mm · 0.83mm/px · 1 of 20 slices shown (21 of 24)]
[im 1/20]
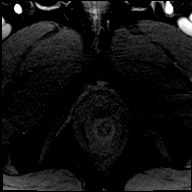

[Series 55: post t1_twist_tra_dyn-copy cent_sub_ttc=(id) · axial · 3.5mm · 0.83mm/px · 1 of 20 slices shown (20 of 22)]
[im 1/20]
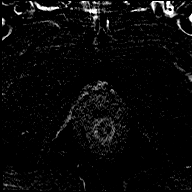

[Series 56: post t1_twist_tra_dyn-copy center · axial · 3.5mm · 0.83mm/px · 1 of 20 slices shown (22 of 24)]
[im 1/20]
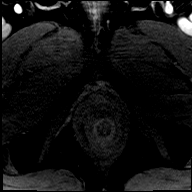

[Series 57: post t1_twist_tra_dyn-copy cent_sub_ttc=(id) · axial · 3.5mm · 0.83mm/px · 1 of 20 slices shown (21 of 22)]
[im 1/20]
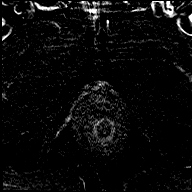

[Series 58: post t1_twist_tra_dyn-copy center · axial · 3.5mm · 0.83mm/px · 1 of 20 slices shown (23 of 24)]
[im 1/20]
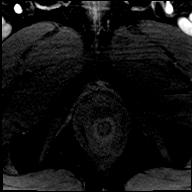

[Series 59: post t1_twist_tra_dyn-copy cent_sub_ttc=(id) · axial · 3.5mm · 0.83mm/px · 1 of 20 slices shown (22 of 22)]
[im 1/20]
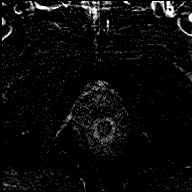

[Series 60: post t1_twist_tra_dyn-copy center · axial · 3.5mm · 0.83mm/px · 1 of 20 slices shown (24 of 24)]
[im 1/20]
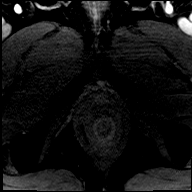

[56 of 56 positions shown; findings below may reference images not displayed]

FINDINGS: Prostate: No findings suspicious for high-grade macroscopic prostate
cancer on MRI.

Specifically, no focal low T2 lesion in the peripheral gland. No
restricted diffusion/low ADC. No early arterial enhancement. PI-RADS
1.

Enlargement/nodularity of the central gland, suggesting BPH. No
suspicious central gland nodule on T2.

Volume: 3.0 x 4.3 x 4.1 cm (volume = 28 mL)

Transcapsular spread:  Absent.

Seminal vesicle involvement: Absent.

Neurovascular bundle involvement: Absent.

Pelvic adenopathy: Absent.

Bone metastasis: Absent.

Other findings: None.
IMPRESSION: No findings suspicious for high-grade macroscopic prostate cancer on
MRI. PI-RADS 1.

## 2019-11-24 DIAGNOSIS — C4441 Basal cell carcinoma of skin of scalp and neck: Secondary | ICD-10-CM | POA: Diagnosis not present

## 2019-11-24 DIAGNOSIS — L738 Other specified follicular disorders: Secondary | ICD-10-CM | POA: Diagnosis not present

## 2019-11-24 DIAGNOSIS — L814 Other melanin hyperpigmentation: Secondary | ICD-10-CM | POA: Diagnosis not present

## 2019-11-24 DIAGNOSIS — D1801 Hemangioma of skin and subcutaneous tissue: Secondary | ICD-10-CM | POA: Diagnosis not present

## 2019-11-24 DIAGNOSIS — C44319 Basal cell carcinoma of skin of other parts of face: Secondary | ICD-10-CM | POA: Diagnosis not present

## 2019-11-24 DIAGNOSIS — D485 Neoplasm of uncertain behavior of skin: Secondary | ICD-10-CM | POA: Diagnosis not present

## 2019-11-24 DIAGNOSIS — D225 Melanocytic nevi of trunk: Secondary | ICD-10-CM | POA: Diagnosis not present

## 2020-01-03 DIAGNOSIS — C44319 Basal cell carcinoma of skin of other parts of face: Secondary | ICD-10-CM | POA: Diagnosis not present

## 2020-01-08 ENCOUNTER — Ambulatory Visit: Payer: BC Managed Care – PPO | Attending: Internal Medicine

## 2020-01-08 ENCOUNTER — Ambulatory Visit: Payer: BC Managed Care – PPO

## 2020-01-08 DIAGNOSIS — Z23 Encounter for immunization: Secondary | ICD-10-CM

## 2020-01-08 NOTE — Progress Notes (Signed)
   Covid-19 Vaccination Clinic  Name:  Benjamin Fletcher    MRN: [id number] DOB: 1961-06-03  01/08/2020  Benjamin Fletcher was observed post Covid-19 immunization for 15 minutes without incident. He was provided with Vaccine Information Sheet and instruction to access the V-Safe system.   Benjamin Fletcher was instructed to call 911 with any severe reactions post vaccine: Marland Kitchen Difficulty breathing  . Swelling of face and throat  . A fast heartbeat  . A bad rash all over body  . Dizziness and weakness   Immunizations Administered    Name Date Dose VIS Date Route   Pfizer COVID-19 Vaccine 01/08/2020  3:31 PM 0.3 mL 09/10/2019 Intramuscular   Manufacturer: [employer], [employer]   Lot: [id number]   [place]: [id number]

## 2020-04-11 ENCOUNTER — Other Ambulatory Visit: Payer: Self-pay

## 2020-04-11 ENCOUNTER — Ambulatory Visit (INDEPENDENT_AMBULATORY_CARE_PROVIDER_SITE_OTHER): Payer: BC Managed Care – PPO | Admitting: Physician Assistant

## 2020-04-11 ENCOUNTER — Encounter: Payer: Self-pay | Admitting: Physician Assistant

## 2020-04-11 VITALS — BP 160/100 | HR 83 | Temp 98.0°F | Resp 16 | Ht 74.5 in | Wt 257.0 lb

## 2020-04-11 DIAGNOSIS — C61 Malignant neoplasm of prostate: Secondary | ICD-10-CM

## 2020-04-11 DIAGNOSIS — K219 Gastro-esophageal reflux disease without esophagitis: Secondary | ICD-10-CM | POA: Diagnosis not present

## 2020-04-11 DIAGNOSIS — I10 Essential (primary) hypertension: Secondary | ICD-10-CM

## 2020-04-11 DIAGNOSIS — Z1211 Encounter for screening for malignant neoplasm of colon: Secondary | ICD-10-CM

## 2020-04-11 DIAGNOSIS — G4733 Obstructive sleep apnea (adult) (pediatric): Secondary | ICD-10-CM

## 2020-04-11 MED ORDER — HYDROCHLOROTHIAZIDE 25 MG PO TABS: 25.0000 mg | ORAL_TABLET | Freq: Every day | ORAL | 2 refills | Status: DC

## 2020-04-11 MED ORDER — PANTOPRAZOLE SODIUM 40 MG PO TBEC: 40.0000 mg | DELAYED_RELEASE_TABLET | Freq: Every day | ORAL | 3 refills | Status: DC

## 2020-04-11 NOTE — Progress Notes (Signed)
Patient presents to clinic today to establish care.  Acute Concerns: Patient endorses noting 3 weeks of heartburn, belching with a sensation of something sitting in his throat. Denies cough, abdominal pain, nausea or vomiting. Denies chest congestion or cough. Has taken TUMs which did help slightly. Notes he has an appointment with ENT to look in his throat but would like assessment today as well.   Chronic Issues: Hypertension -- Patient endorses longer standing history of hypertension, currently on a regimen of losartan 100 mg daily. Endorses taking each morning and tolerating well without any noted side effect. Notes BP at home when he checks it, it will still be above 621 systolic. Patient denies chest pain, palpitations, lightheadedness, dizziness, vision changes or frequent headaches.  Prostate Cancer -- Active. Followed by [facility]. No active treatment. Is being very closely monitored.   OSA -- Notes use of AutoPAP but notes his machine is over a decade old. Was sent a new machine from an urgent care provider but would like to discuss need for repeat testing or machine titration.    Past Medical History:  Diagnosis Date  . Hypertension   . Prostate cancer ([place])   . 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 Sleep apnea     Past Surgical History:  Procedure Laterality Date  . PROSTATE BIOPSY    . skin cancer excised x3    . TONSILLECTOMY      Current Outpatient Medications on File Prior to Visit  Medication Sig Dispense Refill  . losartan (COZAAR) 100 MG tablet Take 1 tablet (100 mg total) by mouth daily. 90 tablet 3   No current facility-administered medications on file prior to visit.    No Known Allergies  Family History  Problem Relation Age of Onset  . Heart disease Father        CABG  . Cancer Father        prostate  . Heart attack Father   . Hyperlipidemia Father   . Hypertension  Mother   . Cancer Brother        kidney and prostate  . Hyperlipidemia Brother   . Diabetes Brother   . Hyperlipidemia Brother   . Kidney disease Brother     Social History   Socioeconomic History  . Marital status: Divorced    Spouse name: Not on file  . Number of children: Not on file  . Years of education: Not on file  . Highest education level: Not on file  Occupational History  . Occupation: HVAC  Tobacco Use  . Smoking status: Former Smoker    Types: Cigarettes  . Smokeless tobacco: Never Used  Vaping Use  . Vaping Use: Never used  Substance and Sexual Activity  . Alcohol use: Yes    Alcohol/week: 12.0 standard drinks    Types: 12 Cans of beer per week    Comment: weekends only  . Drug use: No  . Sexual activity: Yes  Other Topics Concern  . Not on file  Social History Narrative  . Not on file   Social Determinants of Health   Financial Resource Strain:   . Difficulty of Paying Living Expenses:   Food Insecurity:   . Worried About Charity fundraiser in the Last Year:   . Arboriculturist in the Last Year:   Transportation Needs:   . Film/video editor (Medical):   Marland Kitchen  Lack of Transportation (Non-Medical):   Physical Activity:   . Days of Exercise per Week:   . Minutes of Exercise per Session:   Stress:   . Feeling of Stress :   Social Connections:   . Frequency of Communication with Friends and Family:   . Frequency of Social Gatherings with Friends and Family:   . Attends Religious Services:   . Active Member of Clubs or Organizations:   . Attends Archivist Meetings:   Marland Kitchen Marital Status:   Intimate Partner Violence:   . Fear of Current or Ex-Partner:   . Emotionally Abused:   Marland Kitchen Physically Abused:   . Sexually Abused:    ROS Pertinent ROS are listed in the HPI.  BP (!) 150/90   Pulse 83   Temp 98 F (36.7 C) (Temporal)   Resp 16   Ht 6' 2.5" (1.892 m)   Wt 257 lb (116.6 kg)   SpO2 99%   BMI 32.56 kg/m   Physical  Exam Vitals reviewed.  Constitutional:      Appearance: Normal appearance.  HENT:     Head: Normocephalic and atraumatic.     Right Ear: Tympanic membrane normal.     Left Ear: Tympanic membrane normal.  Eyes:     Conjunctiva/sclera: Conjunctivae normal.  Cardiovascular:     Rate and Rhythm: Normal rate and regular rhythm.     Pulses: Normal pulses.     Heart sounds: Normal heart sounds.  Pulmonary:     Effort: Pulmonary effort is normal.     Breath sounds: Normal breath sounds.  Abdominal:     General: Bowel sounds are normal. There is no distension.     Palpations: Abdomen is soft.     Tenderness: There is no abdominal tenderness.  Musculoskeletal:     Cervical back: Neck supple.  Neurological:     Mental Status: He is alert.  Psychiatric:        Mood and Affect: Mood normal.    Assessment/Plan: 1. Gastroesophageal reflux disease without esophagitis Will start Protonix 40 mg once daily. Start GERD diet. Follow-up with ENT as scheduled.  - pantoprazole (PROTONIX) 40 MG tablet; Take 1 tablet (40 mg total) by mouth daily.  Dispense: 30 tablet; Refill: 3  2. Malignant neoplasm of prostate [facility]) Continue management per urology.  3. Essential hypertension BP above goal, even on recheck. Seems this is a chronic thing. Will add on HCTZ 25 mg daily. Continue losartan 100 mg daily. DASH diet. Follow-up scheduled.   4. Colon cancer screening Patient due for screening. Average risk. No rectal bleeding or stool changes. Would like to proceed with Cologuard.  - Cologuard  5. OSA (obstructive sleep apnea) Referral to Pulmonology placed for further management of CPAP device/OSA - Ambulatory referral to Pulmonology  This visit occurred during the SARS-CoV-2 public health emergency.  Safety protocols were in place, including screening questions prior to the visit, additional usage of staff PPE, and extensive cleaning of exam room while observing appropriate contact time as indicated  for disinfecting solutions.      Leeanne Rio, PA-C

## 2020-04-11 NOTE — Assessment & Plan Note (Addendum)
Followed by Dr. Jeffie Pollock -- [facility].  MRI at last visit. Caught early on screening. Monitoring at present.

## 2020-04-11 NOTE — Assessment & Plan Note (Signed)
Chronic. No history of MI/CVA. No prior stress testing.

## 2020-04-11 NOTE — Patient Instructions (Signed)
Please start the Protonix once daily as directed. Avoid late night eating. Elevate the head of your bed.  Try to keep diet low in saturated fats to help with weight loss -- this will help with BP and with reflux.  Follow-up with ENT as scheduled.  In regards to BP, continue the losartan at the current dose. Start the HCTZ once daily as directed. Avoid salty foods. Start the new AutoPAP machine as uncontrolled OSA can cause treatment resistant high blood pressure. We are getting you in with a new sleep apnea specialist.  Follow-up 2 weeks regarding BP. We can do your physical with fasting labs at that time.   You will also be contacted regarding the Cologuard test that will be coming to your home. Complete this ASAP once you receive.    Food Choices for Gastroesophageal Reflux Disease, Adult When you have gastroesophageal reflux disease (GERD), the foods you eat and your eating habits are very important. Choosing the right foods can help ease your discomfort. Think about working with a nutrition specialist (dietitian) to help you make good choices. What are tips for following this plan?  Meals  Choose healthy foods that are low in fat, such as fruits, vegetables, whole grains, low-fat dairy products, and lean meat, fish, and poultry.  Eat small meals often instead of 3 large meals a day. Eat your meals slowly, and in a place where you are relaxed. Avoid bending over or lying down until 2-3 hours after eating.  Avoid eating meals 2-3 hours before bed.  Avoid drinking a lot of liquid with meals.  Cook foods using methods other than frying. Bake, grill, or broil food instead.  Avoid or limit: ? Chocolate. ? Peppermint or spearmint. ? Alcohol. ? Pepper. ? Black and decaffeinated coffee. ? Black and decaffeinated tea. ? Bubbly (carbonated) soft drinks. ? Caffeinated energy drinks and soft drinks.  Limit high-fat foods such as: ? Fatty meat or fried foods. ? Whole milk, cream,  butter, or ice cream. ? Nuts and nut butters. ? Pastries, donuts, and sweets made with butter or shortening.  Avoid foods that cause symptoms. These foods may be different for everyone. Common foods that cause symptoms include: ? Tomatoes. ? Oranges, lemons, and limes. ? Peppers. ? Spicy food. ? Onions and garlic. ? Vinegar. Lifestyle  Maintain a healthy weight. Ask your doctor what weight is healthy for you. If you need to lose weight, work with your doctor to do so safely.  Exercise for at least 30 minutes for 5 or more days each week, or as told by your doctor.  Wear loose-fitting clothes.  Do not smoke. If you need help quitting, ask your doctor.  Sleep with the head of your bed higher than your feet. Use a wedge under the mattress or blocks under the bed frame to raise the head of the bed. Summary  When you have gastroesophageal reflux disease (GERD), food and lifestyle choices are very important in easing your symptoms.  Eat small meals often instead of 3 large meals a day. Eat your meals slowly, and in a place where you are relaxed.  Limit high-fat foods such as fatty meat or fried foods.  Avoid bending over or lying down until 2-3 hours after eating.  Avoid peppermint and spearmint, caffeine, alcohol, and chocolate. This information is not intended to replace advice given to you by your health care provider. Make sure you discuss any questions you have with your health care provider. Document Revised: 01/07/2019 Document  Reviewed: 10/22/2016 Elsevier Patient Education  [employer].

## 2020-04-12 ENCOUNTER — Telehealth: Payer: Self-pay | Admitting: Emergency Medicine

## 2020-04-12 NOTE — Telephone Encounter (Signed)
[facility] requesting a prior authorization for new medication Pantoprazole 40 mg QD Prior authorization started 04/12/20 Key: [id number] Waiting on insurance response

## 2020-04-13 NOTE — Telephone Encounter (Signed)
Pantoprazole has been approved thru Cover My meds.

## 2020-04-25 ENCOUNTER — Other Ambulatory Visit: Payer: Self-pay

## 2020-04-25 ENCOUNTER — Encounter: Payer: Self-pay | Admitting: Physician Assistant

## 2020-04-25 ENCOUNTER — Ambulatory Visit (INDEPENDENT_AMBULATORY_CARE_PROVIDER_SITE_OTHER): Payer: BC Managed Care – PPO | Admitting: Physician Assistant

## 2020-04-25 VITALS — BP 140/84 | HR 83 | Temp 97.3°F | Resp 16 | Ht 74.5 in | Wt 257.0 lb

## 2020-04-25 DIAGNOSIS — Z0000 Encounter for general adult medical examination without abnormal findings: Secondary | ICD-10-CM | POA: Diagnosis not present

## 2020-04-25 DIAGNOSIS — I10 Essential (primary) hypertension: Secondary | ICD-10-CM | POA: Diagnosis not present

## 2020-04-25 DIAGNOSIS — Z1159 Encounter for screening for other viral diseases: Secondary | ICD-10-CM

## 2020-04-25 DIAGNOSIS — Z23 Encounter for immunization: Secondary | ICD-10-CM

## 2020-04-25 DIAGNOSIS — C61 Malignant neoplasm of prostate: Secondary | ICD-10-CM

## 2020-04-25 DIAGNOSIS — Z1212 Encounter for screening for malignant neoplasm of rectum: Secondary | ICD-10-CM | POA: Diagnosis not present

## 2020-04-25 DIAGNOSIS — Z1211 Encounter for screening for malignant neoplasm of colon: Secondary | ICD-10-CM | POA: Diagnosis not present

## 2020-04-25 LAB — COMPREHENSIVE METABOLIC PANEL
ALT: 79 U/L — ABNORMAL HIGH (ref 0–53)
AST: 18 U/L (ref 0–37)
Albumin: 4.5 g/dL (ref 3.5–5.2)
Alkaline Phosphatase: 86 U/L (ref 39–117)
BUN: 16 mg/dL (ref 6–23)
CO2: 26 mEq/L (ref 19–32)
Calcium: 9.7 mg/dL (ref 8.4–10.5)
Chloride: 99 mEq/L (ref 96–112)
Creatinine, Ser: 1.12 mg/dL (ref 0.40–1.50)
GFR: 67.29 mL/min (ref >60.00)
Glucose, Bld: 248 mg/dL — ABNORMAL HIGH (ref 70–99)
Potassium: 3.6 mEq/L (ref 3.5–5.1)
Sodium: 136 mEq/L (ref 135–145)
Total Bilirubin: 0.8 mg/dL (ref 0.2–1.2)
Total Protein: 6.7 g/dL (ref 6.0–8.3)

## 2020-04-25 LAB — COLOGUARD: Cologuard: NEGATIVE

## 2020-04-25 LAB — CBC WITH DIFFERENTIAL/PLATELET
Basophils Absolute: 0.1 10*3/uL (ref 0.0–0.1)
Basophils Relative: 1.1 % (ref 0.0–3.0)
Eosinophils Absolute: 0.1 10*3/uL (ref 0.0–0.7)
Eosinophils Relative: 1.9 % (ref 0.0–5.0)
HCT: 46.3 % (ref 39.0–52.0)
Hemoglobin: 16.5 g/dL (ref 13.0–17.0)
Lymphocytes Relative: 22.2 % (ref 12.0–46.0)
Lymphs Abs: 1.6 10*3/uL (ref 0.7–4.0)
MCHC: 35.5 g/dL (ref 30.0–36.0)
MCV: 92.9 fl (ref 78.0–100.0)
Monocytes Absolute: 0.5 10*3/uL (ref 0.1–1.0)
Monocytes Relative: 7.2 % (ref 3.0–12.0)
Neutro Abs: 5 10*3/uL (ref 1.4–7.7)
Neutrophils Relative %: 67.6 % (ref 43.0–77.0)
Platelets: 178 10*3/uL (ref 150.0–400.0)
RBC: 4.98 Mil/uL (ref 4.22–5.81)
RDW: 13.1 % (ref 11.5–15.5)
WBC: 7.4 10*3/uL (ref 4.0–10.5)

## 2020-04-25 LAB — LDL CHOLESTEROL, DIRECT: Direct LDL: 121 mg/dL

## 2020-04-25 LAB — LIPID PANEL
Cholesterol: 249 mg/dL — ABNORMAL HIGH (ref 0–200)
HDL: 43.1 mg/dL (ref >39.00)
Total CHOL/HDL Ratio: 6
Triglycerides: 486 mg/dL — ABNORMAL HIGH (ref 0.0–149.0)

## 2020-04-25 LAB — HEMOGLOBIN A1C: Hgb A1c MFr Bld: 8.6 % — ABNORMAL HIGH (ref 4.6–6.5)

## 2020-04-25 MED ORDER — LOSARTAN POTASSIUM 100 MG PO TABS: 100.0000 mg | ORAL_TABLET | Freq: Every day | ORAL | 1 refills | Status: DC

## 2020-04-25 MED ORDER — AMLODIPINE BESYLATE 5 MG PO TABS: 5.0000 mg | ORAL_TABLET | Freq: Every day | ORAL | 1 refills | Status: DC

## 2020-04-25 NOTE — Progress Notes (Signed)
Patient presents to clinic today for annual exam.  Patient is fasting for labs.  Diet -- Tries to keep a well-balanced diet. Tries to limit fried foods as much as possible.   Exercise -- No current regimen outside of work. Does stay very active at work -- cardio and resistance.   Acute Concerns: Denies acute concerns at today's visit.   Chronic Issues: GERD -- Taking Protonix 40 mg daily. Notes resolution of heart burn but will occasionally still note some reflux. Has follow-up with ENT on 05/04/20  Hypertension -- Currently on a regimen of losartan 100 mg daily and HCTZ 25 mg daily. Endorses taking medications as directed. Patient denies chest pain, palpitations, lightheadedness, dizziness, vision changes or frequent headaches.  BP Readings from Last 3 Encounters:  04/25/20 (!) 140/84  04/11/20 (!) 160/100  10/20/18 (!) 183/91   Prostate Cancer -- Has follow-up tomorrow at 3PM with Dr. Jeffie Pollock [facility]).   Health Maintenance: Immunizations -- UTD on most. Due for TDaP.  Colon Cancer Screening -- Cologuard kit was received by patient. He notes completed and sent in this morning. Awaiting results.   Past Medical History:  Diagnosis Date  . Hypertension   . Prostate cancer ([place])   . 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 Sleep apnea     Past Surgical History:  Procedure Laterality Date  . PROSTATE BIOPSY    . skin cancer excised x3    . TONSILLECTOMY      Current Outpatient Medications on File Prior to Visit  Medication Sig Dispense Refill  . hydrochlorothiazide (HYDRODIURIL) 25 MG tablet Take 1 tablet (25 mg total) by mouth daily. 30 tablet 2  . losartan (COZAAR) 100 MG tablet Take 1 tablet (100 mg total) by mouth daily. 90 tablet 3  . pantoprazole (PROTONIX) 40 MG tablet Take 1 tablet (40 mg total) by mouth daily. 30 tablet 3   No current facility-administered medications on file prior to  visit.    No Known Allergies  Family History  Problem Relation Age of Onset  . Heart disease Father        CABG  . Cancer Father        prostate  . Heart attack Father   . Hyperlipidemia Father   . Hypertension Mother   . Cancer Brother        kidney and prostate  . Hyperlipidemia Brother   . Diabetes Brother   . Hyperlipidemia Brother   . Kidney disease Brother     Social History   Socioeconomic History  . Marital status: Divorced    Spouse name: Not on file  . Number of children: Not on file  . Years of education: Not on file  . Highest education level: Not on file  Occupational History  . Occupation: HVAC  Tobacco Use  . Smoking status: Former Smoker    Types: Cigarettes  . Smokeless tobacco: Never Used  Vaping Use  . Vaping Use: Never used  Substance and Sexual Activity  . Alcohol use: Yes    Alcohol/week: 12.0 standard drinks    Types: 12 Cans of beer per week    Comment: weekends only  . Drug use: No  . Sexual activity: Yes  Other Topics Concern  . Not on file  Social History Narrative  . Not on file   Social Determinants of Health   Financial Resource Strain:   .  Difficulty of Paying Living Expenses:   Food Insecurity:   . Worried About Charity fundraiser in the Last Year:   . Arboriculturist in the Last Year:   Transportation Needs:   . Film/video editor (Medical):   Marland Kitchen Lack of Transportation (Non-Medical):   Physical Activity:   . Days of Exercise per Week:   . Minutes of Exercise per Session:   Stress:   . Feeling of Stress :   Social Connections:   . Frequency of Communication with Friends and Family:   . Frequency of Social Gatherings with Friends and Family:   . Attends Religious Services:   . Active Member of Clubs or Organizations:   . Attends Archivist Meetings:   Marland Kitchen Marital Status:   Intimate Partner Violence:   . Fear of Current or Ex-Partner:   . Emotionally Abused:   Marland Kitchen Physically Abused:   . Sexually  Abused:    Review of Systems  Constitutional: Negative for fever and weight loss.  HENT: Negative for ear discharge, ear pain, hearing loss and tinnitus.   Eyes: Negative for blurred vision, double vision, photophobia and pain.  Respiratory: Negative for cough and shortness of breath.   Cardiovascular: Negative for chest pain and palpitations.  Gastrointestinal: Positive for heartburn. Negative for abdominal pain, blood in stool, constipation, diarrhea, melena, nausea and vomiting.  Genitourinary: Negative for dysuria, flank pain, frequency, hematuria and urgency.  Musculoskeletal: Negative for falls.  Neurological: Negative for dizziness, loss of consciousness and headaches.  Endo/Heme/Allergies: Negative for environmental allergies.  Psychiatric/Behavioral: Negative for depression, hallucinations, substance abuse and suicidal ideas. The patient is not nervous/anxious and does not have insomnia.      BP (!) 140/84   Pulse 83   Temp (!) 97.3 F (36.3 C) (Temporal)   Resp 16   Ht 6' 2.5" (1.892 m)   Wt (!) 257 lb (116.6 kg)   SpO2 98%   BMI 32.56 kg/m   Physical Exam Vitals reviewed.  Constitutional:      General: He is not in acute distress.    Appearance: He is well-developed. He is not diaphoretic.  HENT:     Head: Normocephalic and atraumatic.     Right Ear: Tympanic membrane, ear canal and external ear normal.     Left Ear: Tympanic membrane, ear canal and external ear normal.     Nose: Nose normal.     Mouth/Throat:     Pharynx: No posterior oropharyngeal erythema.  Eyes:     Conjunctiva/sclera: Conjunctivae normal.     Pupils: Pupils are equal, round, and reactive to light.  Neck:     Thyroid: No thyromegaly.  Cardiovascular:     Rate and Rhythm: Normal rate and regular rhythm.     Heart sounds: Normal heart sounds.  Pulmonary:     Effort: Pulmonary effort is normal. No respiratory distress.     Breath sounds: Normal breath sounds. No wheezing or rales.    Chest:     Chest wall: No tenderness.  Abdominal:     General: Bowel sounds are normal. There is no distension.     Palpations: Abdomen is soft. There is no mass.     Tenderness: There is no abdominal tenderness. There is no guarding or rebound.  Musculoskeletal:     Cervical back: Neck supple.  Lymphadenopathy:     Cervical: No cervical adenopathy.  Skin:    General: Skin is warm and dry.  Findings: No rash.  Neurological:     Mental Status: He is alert and oriented to person, place, and time.     Cranial Nerves: No cranial nerve deficit.     Assessment/Plan: 1. Visit for preventive health examination Depression screen negative. Health Maintenance reviewed. Preventive schedule discussed and handout given in AVS. Will obtain fasting labs today.  - CBC with Differential/Platelet - Comprehensive metabolic panel - Hemoglobin A1c - Lipid panel  2. Need for hepatitis C screening test Per CDC/[place] preventative service task force guidelines.  Will obtain hep C antibody today. - Hepatitis C Antibody  3. Need for Tdap vaccination - Tdap vaccine greater than or equal to 7yo IM  4. Essential hypertension BP still above goal.  Thankfully asymptomatic.  Dietary and exercise recommendations reviewed with patient.  We will have him continue his losartan and HCTZ daily.  We will add on amlodipine 5 mg daily.  Repeat fasting lipids.  We will also check CMP and A1c today.  Follow-up 4 weeks.  Sooner if needed. - Comprehensive metabolic panel - Hemoglobin A1c - Lipid panel - amLODipine (NORVASC) 5 MG tablet; Take 1 tablet (5 mg total) by mouth daily.  Dispense: 30 tablet; Refill: 1 - losartan (COZAAR) 100 MG tablet; Take 1 tablet (100 mg total) by mouth daily.  Dispense: 90 tablet; Refill: 1  5. Malignant neoplasm of prostate [facility]) Continue management per urology.  This visit occurred during the SARS-CoV-2 public health emergency.  Safety protocols were in place, including screening  questions prior to the visit, additional usage of staff PPE, and extensive cleaning of exam room while observing appropriate contact time as indicated for disinfecting solutions.     Leeanne Rio, PA-C

## 2020-04-25 NOTE — Patient Instructions (Signed)
Please go to the lab for blood work.   Our office will call you with your results unless you have chosen to receive results via MyChart.  If your blood work is normal we will follow-up each year for physicals and as scheduled for chronic medical problems.  If anything is abnormal we will treat accordingly and get you in for a follow-up.  I am adding on amlodipine 5 mg daily for BP. Continue other medications as directed. Keep working on low sodium diet. Follow-up in 4 weeks for re-evaluation. .    Preventive Care 58-14 Years Old, Male Preventive care refers to lifestyle choices and visits with your health care provider that can promote health and wellness. This includes:  A yearly physical exam. This is also called an annual well check.  Regular dental and eye exams.  Immunizations.  Screening for certain conditions.  Healthy lifestyle choices, such as eating a healthy diet, getting regular exercise, not using drugs or products that contain nicotine and tobacco, and limiting alcohol use. What can I expect for my preventive care visit? Physical exam Your health care provider will check:  Height and weight. These may be used to calculate body mass index (BMI), which is a measurement that tells if you are at a healthy weight.  Heart rate and blood pressure.  Your skin for abnormal spots. Counseling Your health care provider may ask you questions about:  Alcohol, tobacco, and drug use.  Emotional well-being.  Home and relationship well-being.  Sexual activity.  Eating habits.  Work and work Statistician. What immunizations do I need?  Influenza (flu) vaccine  This is recommended every year. Tetanus, diphtheria, and pertussis (Tdap) vaccine  You may need a Td booster every 10 years. Varicella (chickenpox) vaccine  You may need this vaccine if you have not already been vaccinated. Zoster (shingles) vaccine  You may need this after age 58. Measles, mumps, and  rubella (MMR) vaccine  You may need at least one dose of MMR if you were born in 1957 or later. You may also need a second dose. Pneumococcal conjugate (PCV13) vaccine  You may need this if you have certain conditions and were not previously vaccinated. Pneumococcal polysaccharide (PPSV23) vaccine  You may need one or two doses if you smoke cigarettes or if you have certain conditions. Meningococcal conjugate (MenACWY) vaccine  You may need this if you have certain conditions. Hepatitis A vaccine  You may need this if you have certain conditions or if you travel or work in places where you may be exposed to hepatitis A. Hepatitis B vaccine  You may need this if you have certain conditions or if you travel or work in places where you may be exposed to hepatitis B. Haemophilus influenzae type b (Hib) vaccine  You may need this if you have certain risk factors. Human papillomavirus (HPV) vaccine  If recommended by your health care provider, you may need three doses over 6 months. You may receive vaccines as individual doses or as more than one vaccine together in one shot (combination vaccines). Talk with your health care provider about the risks and benefits of combination vaccines. What tests do I need? Blood tests  Lipid and cholesterol levels. These may be checked every 5 years, or more frequently if you are over 58 years old.  Hepatitis C test.  Hepatitis B test. Screening  Lung cancer screening. You may have this screening every year starting at age 58 if you have a 30-pack-year history of  smoking and currently smoke or have quit within the past 15 years.  Prostate cancer screening. Recommendations will vary depending on your family history and other risks.  Colorectal cancer screening. All adults should have this screening starting at age 58 and continuing until age 30. Your health care provider may recommend screening at age 58 if you are at increased risk. You will have  tests every 1-10 years, depending on your results and the type of screening test.  Diabetes screening. This is done by checking your blood sugar (glucose) after you have not eaten for a while (fasting). You may have this done every 1-3 years.  Sexually transmitted disease (STD) testing. Follow these instructions at home: Eating and drinking  Eat a diet that includes fresh fruits and vegetables, whole grains, lean protein, and low-fat dairy products.  Take vitamin and mineral supplements as recommended by your health care provider.  Do not drink alcohol if your health care provider tells you not to drink.  If you drink alcohol: ? Limit how much you have to 0-2 drinks a day. ? Be aware of how much alcohol is in your drink. In the [place] one drink equals one 12 oz bottle of beer (355 mL), one 5 oz glass of wine (148 mL), or one 1 oz glass of hard liquor (44 mL). Lifestyle  Take daily care of your teeth and gums.  Stay active. Exercise for at least 30 minutes on 5 or more days each week.  Do not use any products that contain nicotine or tobacco, such as cigarettes, e-cigarettes, and chewing tobacco. If you need help quitting, ask your health care provider.  If you are sexually active, practice safe sex. Use a condom or other form of protection to prevent STIs (sexually transmitted infections).  Talk with your health care provider about taking a low-dose aspirin every day starting at age 58. What's next?  Go to your health care provider once a year for a well check visit.  Ask your health care provider how often you should have your eyes and teeth checked.  Stay up to date on all vaccines. This information is not intended to replace advice given to you by your health care provider. Make sure you discuss any questions you have with your health care provider. Document Revised: 09/10/2018 Document Reviewed: 09/10/2018 Elsevier Patient Education  2020 [employer].

## 2020-04-26 DIAGNOSIS — C61 Malignant neoplasm of prostate: Secondary | ICD-10-CM | POA: Diagnosis not present

## 2020-04-26 DIAGNOSIS — N400 Enlarged prostate without lower urinary tract symptoms: Secondary | ICD-10-CM | POA: Diagnosis not present

## 2020-04-26 DIAGNOSIS — R972 Elevated prostate specific antigen [PSA]: Secondary | ICD-10-CM | POA: Diagnosis not present

## 2020-04-26 LAB — HEPATITIS C ANTIBODY
Hepatitis C Ab: NONREACTIVE
SIGNAL TO CUT-OFF: 0 (ref <1.00)

## 2020-05-01 LAB — COLOGUARD
COLOGUARD: NEGATIVE
Cologuard: NEGATIVE

## 2020-05-03 ENCOUNTER — Encounter: Payer: Self-pay | Admitting: Physician Assistant

## 2020-05-03 ENCOUNTER — Ambulatory Visit (INDEPENDENT_AMBULATORY_CARE_PROVIDER_SITE_OTHER): Payer: BC Managed Care – PPO | Admitting: Physician Assistant

## 2020-05-03 ENCOUNTER — Other Ambulatory Visit: Payer: Self-pay

## 2020-05-03 VITALS — BP 150/88 | HR 81 | Temp 98.1°F | Resp 14 | Ht 74.5 in | Wt 255.0 lb

## 2020-05-03 DIAGNOSIS — Z23 Encounter for immunization: Secondary | ICD-10-CM | POA: Diagnosis not present

## 2020-05-03 DIAGNOSIS — I10 Essential (primary) hypertension: Secondary | ICD-10-CM | POA: Diagnosis not present

## 2020-05-03 DIAGNOSIS — E1165 Type 2 diabetes mellitus with hyperglycemia: Secondary | ICD-10-CM

## 2020-05-03 DIAGNOSIS — E1169 Type 2 diabetes mellitus with other specified complication: Secondary | ICD-10-CM | POA: Diagnosis not present

## 2020-05-03 DIAGNOSIS — E785 Hyperlipidemia, unspecified: Secondary | ICD-10-CM

## 2020-05-03 DIAGNOSIS — E1159 Type 2 diabetes mellitus with other circulatory complications: Secondary | ICD-10-CM

## 2020-05-03 NOTE — Addendum Note (Signed)
Addended by: Leonidas Romberg on: 05/03/2020 12:00 PM   Modules accepted: Orders

## 2020-05-03 NOTE — Patient Instructions (Signed)
Stop the HCTZ.  Continue the Losartan daily. Start the amlodipine taking daily as directed.   Please call your Eye doctor to schedule an appointment.  We have updated your pneumonia vaccine today. The next will be due at age 59.  Read through the handouts to help with exercise and meal planning. Take the Metformin daily as directed. I am not making you check daily sugars yet but this may change in the future.   Follow-up 4 weeks for reassessment of BP. Follow-up every 3 months for diabetes.    Diabetes Mellitus and Exercise Exercising regularly is important for your overall health, especially when you have diabetes (diabetes mellitus). Exercising is not only about losing weight. It has many other health benefits, such as increasing muscle strength and bone density and reducing body fat and stress. This leads to improved fitness, flexibility, and endurance, all of which result in better overall health. Exercise has additional benefits for people with diabetes, including:  Reducing appetite.  Helping to lower and control blood glucose.  Lowering blood pressure.  Helping to control amounts of fatty substances (lipids) in the blood, such as cholesterol and triglycerides.  Helping the body to respond better to insulin (improving insulin sensitivity).  Reducing how much insulin the body needs.  Decreasing the risk for heart disease by: ? Lowering cholesterol and triglyceride levels. ? Increasing the levels of good cholesterol. ? Lowering blood glucose levels. What is my activity plan? Your health care provider or certified diabetes educator can help you make a plan for the type and frequency of exercise (activity plan) that works for you. Make sure that you:  Do at least 150 minutes of moderate-intensity or vigorous-intensity exercise each week. This could be brisk walking, biking, or water aerobics. ? Do stretching and strength exercises, such as yoga or weightlifting, at least 2  times a week. ? Spread out your activity over at least 3 days of the week.  Get some form of physical activity every day. ? Do not go more than 2 days in a row without some kind of physical activity. ? Avoid being inactive for more than 30 minutes at a time. Take frequent breaks to walk or stretch.  Choose a type of exercise or activity that you enjoy, and set realistic goals.  Start slowly, and gradually increase the intensity of your exercise over time. What do I need to know about managing my diabetes?   Check your blood glucose before and after exercising. ? If your blood glucose is 240 mg/dL (13.3 mmol/L) or higher before you exercise, check your urine for ketones. If you have ketones in your urine, do not exercise until your blood glucose returns to normal. ? If your blood glucose is 100 mg/dL (5.6 mmol/L) or lower, eat a snack containing 15-20 grams of carbohydrate. Check your blood glucose 15 minutes after the snack to make sure that your level is above 100 mg/dL (5.6 mmol/L) before you start your exercise.  Know the symptoms of low blood glucose (hypoglycemia) and how to treat it. Your risk for hypoglycemia increases during and after exercise. Common symptoms of hypoglycemia can include: ? Hunger. ? Anxiety. ? Sweating and feeling clammy. ? Confusion. ? Dizziness or feeling light-headed. ? Increased heart rate or palpitations. ? Blurry vision. ? Tingling or numbness around the mouth, lips, or tongue. ? Tremors or shakes. ? Irritability.  Keep a rapid-acting carbohydrate snack available before, during, and after exercise to help prevent or treat hypoglycemia.  Avoid injecting insulin  into areas of the body that are going to be exercised. For example, avoid injecting insulin into: ? The arms, when playing tennis. ? The legs, when jogging.  Keep records of your exercise habits. Doing this can help you and your health care provider adjust your diabetes management plan as  needed. Write down: ? Food that you eat before and after you exercise. ? Blood glucose levels before and after you exercise. ? The type and amount of exercise you have done. ? When your insulin is expected to peak, if you use insulin. Avoid exercising at times when your insulin is peaking.  When you start a new exercise or activity, work with your health care provider to make sure the activity is safe for you, and to adjust your insulin, medicines, or food intake as needed.  Drink plenty of water while you exercise to prevent dehydration or heat stroke. Drink enough fluid to keep your urine clear or pale yellow. Summary  Exercising regularly is important for your overall health, especially when you have diabetes (diabetes mellitus).  Exercising has many health benefits, such as increasing muscle strength and bone density and reducing body fat and stress.  Your health care provider or certified diabetes educator can help you make a plan for the type and frequency of exercise (activity plan) that works for you.  When you start a new exercise or activity, work with your health care provider to make sure the activity is safe for you, and to adjust your insulin, medicines, or food intake as needed. This information is not intended to replace advice given to you by your health care provider. Make sure you discuss any questions you have with your health care provider. Document Revised: 04/10/2017 Document Reviewed: 02/26/2016 Elsevier Patient Education  [place].

## 2020-05-03 NOTE — Progress Notes (Signed)
Patient presents to clinic today to discuss recent diagnosis of type 2 diabetes, without known complication.  At recent visit for checkup A1c was found to be at 8.6 giving diagnosis of his diabetes.  Patient also with history of hypertension, currently treated with medication and hyperlipidemia, patient previously refusing medication.  Denies any issue with vision changes, neuropathy symptoms, polydipsia, polyuria or polyphagia.  Is currently on losartan 100 mg daily for blood pressure control.  Is also on hydrochlorothiazide 25 mg daily which she endorses taking as directed.  Notes real issue tolerating this recently since he has had to work outside.  Has noted some muscle cramping.  As such he stopped this medication.  Patient was also started on amlodipine 5 mg at last visit due to uncontrolled blood pressure.  Patient stated he has not picked this up or started taking. Patient denies chest pain, palpitations, lightheadedness, dizziness, vision changes or frequent headaches.  Patient notes that his last eye examination was around 4 years ago.  Past Medical History:  Diagnosis Date  . Hypertension   . Prostate cancer ([place])   . 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 Sleep apnea     Current Outpatient Medications on File Prior to Visit  Medication Sig Dispense Refill  . losartan (COZAAR) 100 MG tablet Take 1 tablet (100 mg total) by mouth daily. 90 tablet 1  . pantoprazole (PROTONIX) 40 MG tablet Take 1 tablet (40 mg total) by mouth daily. 30 tablet 3  . amLODipine (NORVASC) 5 MG tablet Take 1 tablet (5 mg total) by mouth daily. (Patient not taking: Reported on 05/03/2020) 30 tablet 1  . hydrochlorothiazide (HYDRODIURIL) 25 MG tablet Take 1 tablet (25 mg total) by mouth daily. (Patient not taking: Reported on 05/03/2020) 30 tablet 2   No current facility-administered medications on file prior to visit.    No Known  Allergies  Family History  Problem Relation Age of Onset  . Heart disease Father        CABG  . Cancer Father        prostate  . Heart attack Father   . Hyperlipidemia Father   . Hypertension Mother   . Cancer Brother        kidney and prostate  . Hyperlipidemia Brother   . Diabetes Brother   . Hyperlipidemia Brother   . Kidney disease Brother     Social History   Socioeconomic History  . Marital status: Divorced    Spouse name: Not on file  . Number of children: Not on file  . Years of education: Not on file  . Highest education level: Not on file  Occupational History  . Occupation: HVAC  Tobacco Use  . Smoking status: Former Smoker    Types: Cigarettes  . Smokeless tobacco: Never Used  Vaping Use  . Vaping Use: Never used  Substance and Sexual Activity  . Alcohol use: Yes    Alcohol/week: 12.0 standard drinks    Types: 12 Cans of beer per week    Comment: weekends only  . Drug use: No  . Sexual activity: Yes  Other Topics Concern  . Not on file  Social History Narrative  . Not on file   Social Determinants of Health   Financial Resource Strain:   . Difficulty of Paying Living Expenses:   Food Insecurity:   . Worried About Charity fundraiser in  the Last Year:   . [place] in the Last Year:   Transportation Needs:   . Film/video editor (Medical):   Marland Kitchen Lack of Transportation (Non-Medical):   Physical Activity:   . Days of Exercise per Week:   . Minutes of Exercise per Session:   Stress:   . Feeling of Stress :   Social Connections:   . Frequency of Communication with Friends and Family:   . Frequency of Social Gatherings with Friends and Family:   . Attends Religious Services:   . Active Member of Clubs or Organizations:   . Attends Archivist Meetings:   Marland Kitchen Marital Status:    Review of Systems - See HPI.  All other ROS are negative.  BP (!) 150/88   Pulse 81   Temp 98.1 F (36.7 C) (Temporal)   Resp 14   Ht 6' 2.5"  (1.892 m)   Wt 255 lb (115.7 kg)   SpO2 98%   BMI 32.30 kg/m   Physical Exam Vitals reviewed.  Constitutional:      Appearance: Normal appearance.  HENT:     Head: Normocephalic and atraumatic.  Cardiovascular:     Rate and Rhythm: Normal rate and regular rhythm.     Pulses: Normal pulses.     Heart sounds: Normal heart sounds.  Pulmonary:     Effort: Pulmonary effort is normal.     Breath sounds: Normal breath sounds.  Musculoskeletal:     Cervical back: Neck supple.  Neurological:     General: No focal deficit present.     Mental Status: He is alert and oriented to person, place, and time.  Psychiatric:        Mood and Affect: Mood normal.    Diabetic Foot Form - Detailed   Diabetic Foot Exam - detailed Diabetic Foot exam was performed with the following findings: Yes 05/03/2020 11:19 AM  Visual Foot Exam completed.: Yes  Can the patient see the bottom of their feet?: Yes Are the shoes appropriate in style and fit?: Yes Is there swelling or and abnormal foot shape?: No Is there a claw toe deformity?: No Is there elevated skin temparature?: No Is there foot or ankle muscle weakness?: No Normal Range of Motion: Yes Pulse Foot Exam completed.: Yes  Right posterior Tibialias: Present Left posterior Tibialias: Present  Right Dorsalis Pedis: Present Left Dorsalis Pedis: Present  Sensory Foot Exam Completed.: Yes Semmes-Weinstein Monofilament Test R Site 1-Great Toe: Pos L Site 1-Great Toe: Pos         Recent Results (from the past 2160 hour(s))  Cologuard     Status: None   Collection Time: 04/25/20 12:00 AM  Result Value Ref Range   Cologuard Negative Negative  CBC with Differential/Platelet     Status: None   Collection Time: 04/25/20 10:06 AM  Result Value Ref Range   WBC 7.4 4.0 - 10.5 K/uL   RBC 4.98 4.22 - 5.81 Mil/uL   Hemoglobin 16.5 13.0 - 17.0 g/dL   HCT 46.3 39 - 52 %   MCV 92.9 78.0 - 100.0 fl   MCHC 35.5 30.0 - 36.0 g/dL   RDW 13.1 11.5 - 15.5 %    Platelets 178.0 150 - 400 K/uL   Neutrophils Relative % 67.6 43 - 77 %   Lymphocytes Relative 22.2 12 - 46 %   Monocytes Relative 7.2 3 - 12 %   Eosinophils Relative 1.9 0 - 5 %   Basophils Relative  1.1 0 - 3 %   Neutro Abs 5.0 1.4 - 7.7 K/uL   Lymphs Abs 1.6 0.7 - 4.0 K/uL   Monocytes Absolute 0.5 0 - 1 K/uL   Eosinophils Absolute 0.1 0 - 0 K/uL   Basophils Absolute 0.1 0 - 0 K/uL  Comprehensive metabolic panel     Status: Abnormal   Collection Time: 04/25/20 10:06 AM  Result Value Ref Range   Sodium 136 135 - 145 mEq/L   Potassium 3.6 3.5 - 5.1 mEq/L   Chloride 99 96 - 112 mEq/L   CO2 26 19 - 32 mEq/L   Glucose, Bld 248 (H) 70 - 99 mg/dL   BUN 16 6 - 23 mg/dL   Creatinine, Ser 1.12 0.40 - 1.50 mg/dL   Total Bilirubin 0.8 0.2 - 1.2 mg/dL   Alkaline Phosphatase 86 39 - 117 U/L   AST 18 0 - 37 U/L   ALT 79 (H) 0 - 53 U/L   Total Protein 6.7 6.0 - 8.3 g/dL   Albumin 4.5 3.5 - 5.2 g/dL   GFR 67.29 >60.00 mL/min   Calcium 9.7 8.4 - 10.5 mg/dL  Hemoglobin A1c     Status: Abnormal   Collection Time: 04/25/20 10:06 AM  Result Value Ref Range   Hgb A1c MFr Bld 8.6 (H) 4.6 - 6.5 %    Comment: Glycemic Control Guidelines for People with Diabetes:Non Diabetic:  <6%Goal of Therapy: <7%Additional Action Suggested:  >8%   Lipid panel     Status: Abnormal   Collection Time: 04/25/20 10:06 AM  Result Value Ref Range   Cholesterol 249 (H) 0 - 200 mg/dL    Comment: ATP III Classification       Desirable:  < 200 mg/dL               Borderline High:  200 - 239 mg/dL          High:  > = 240 mg/dL   Triglycerides (H) 0 - 149 mg/dL    486.0 Triglyceride is over 400; calculations on Lipids are invalid.    Comment: Normal:  <150 mg/dLBorderline High:  150 - 199 mg/dL   HDL 43.10 >39.00 mg/dL   Total CHOL/HDL Ratio 6     Comment:                Men          Women1/2 Average Risk     3.4          3.3Average Risk          5.0          4.42X Average Risk          9.6          7.13X Average Risk           15.0          11.0                      Hepatitis C Antibody     Status: None   Collection Time: 04/25/20 10:06 AM  Result Value Ref Range   Hepatitis C Ab NON-REACTIVE NON-REACTI   SIGNAL TO CUT-OFF 0.00 <1.00    Comment: . HCV antibody was non-reactive. There is no laboratory  evidence of HCV infection. . In most cases, no further action is required. However, if recent HCV exposure is suspected, a test for HCV RNA (test code [phone]) is suggested. . For additional  information please refer to http://education.[facility].com/faq/FAQ22v1 (This link is being provided for informational/ educational purposes only.) .   LDL cholesterol, direct     Status: None   Collection Time: 04/25/20 10:06 AM  Result Value Ref Range   Direct LDL 121.0 mg/dL    Comment: Optimal:  <100 mg/dLNear or Above Optimal:  100-129 mg/dLBorderline High:  130-159 mg/dLHigh:  160-189 mg/dLVery High:  >190 mg/dL   Assessment/Plan: 1. Uncontrolled type 2 diabetes mellitus with hyperglycemia ([place]) Newly diagnosed.  Asymptomatic.  Foot examination completed today.  No abnormal findings.  Reviewed routine foot care with patient.  Patient is overdue for eye examination.  He is going to contact his provider to schedule this.  Pneumovax due giving this diagnosis.  He agrees to this today.  He does agree to start Metformin 500 mg once daily.  Rx sent to pharmacy.  Continue losartan as directed.  Discussed with him given the diagnosis of diabetes and his cholesterol being above goal really should start statin medication.  He would like to postpone this at present, wanting to work very hard on diet and exercise to see if he can get things to go on his own.  Reviewed proper diet and exercise recommendations given his diagnosis.  Several meal planning handouts given to him to review and follow.  We will not have him check daily sugars at present but may need to consider this in the future.  Follow-up scheduled.  2.  Hypertension associated with type 2 diabetes mellitus ([place]) Working outside a lot in the heat causing significant sweating and electrolyte loss.  Not a good combination with his current hydrochlorothiazide.  We will stop HCTZ.  We will have him continue losartan 100 mg daily.  He is to start amlodipine 5 mg daily.  Continue DASH diet.  Follow-up in 3 to 4 weeks.  Sooner if needed.  3. Hyperlipidemia associated with type 2 diabetes mellitus ([place])  Discussed with him given the diagnosis of diabetes and his cholesterol being above goal really should start statin medication.  He would like to postpone this at present, wanting to work very hard on diet and exercise to see if he can get things to go on his own.  Reviewed proper diet and exercise recommendations given his diagnosis.   This visit occurred during the SARS-CoV-2 public health emergency.  Safety protocols were in place, including screening questions prior to the visit, additional usage of staff PPE, and extensive cleaning of exam room while observing appropriate contact time as indicated for disinfecting solutions.     Leeanne Rio, PA-C

## 2020-05-04 DIAGNOSIS — K219 Gastro-esophageal reflux disease without esophagitis: Secondary | ICD-10-CM | POA: Diagnosis not present

## 2020-05-23 ENCOUNTER — Ambulatory Visit: Payer: BC Managed Care – PPO | Admitting: Physician Assistant

## 2020-05-25 ENCOUNTER — Other Ambulatory Visit: Payer: Self-pay | Admitting: Physician Assistant

## 2020-05-25 DIAGNOSIS — I10 Essential (primary) hypertension: Secondary | ICD-10-CM

## 2020-05-31 ENCOUNTER — Ambulatory Visit (INDEPENDENT_AMBULATORY_CARE_PROVIDER_SITE_OTHER): Payer: BC Managed Care – PPO | Admitting: Physician Assistant

## 2020-05-31 ENCOUNTER — Encounter: Payer: Self-pay | Admitting: Physician Assistant

## 2020-05-31 ENCOUNTER — Other Ambulatory Visit: Payer: Self-pay

## 2020-05-31 VITALS — BP 140/84 | HR 70 | Temp 97.9°F | Resp 16 | Ht 74.5 in | Wt 247.0 lb

## 2020-05-31 DIAGNOSIS — I10 Essential (primary) hypertension: Secondary | ICD-10-CM

## 2020-05-31 MED ORDER — AMLODIPINE BESYLATE 5 MG PO TABS
7.5000 mg | ORAL_TABLET | Freq: Every day | ORAL | 0 refills | Status: DC
Start: 2020-05-31 — End: 2020-08-22

## 2020-05-31 NOTE — Progress Notes (Signed)
Patient presents to clinic today for follow-up of hypertension. At last visit patient was started on amlodipine 5 mg daily instead of HCTZ since he was not able to tolerate. Was kept on losartan 100 mg daily. Endorses taking medication as directed and tolerating well overall. Patient denies chest pain, palpitations, lightheadedness, dizziness, vision changes or frequent headaches. Notes he was checking BP at home with an old cuff but readings were highly variable so he stopped checking. Is still waiting on new CPAP machine. States is 6 weeks out. Is still using old machine.   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 90 tablet 1   pantoprazole (PROTONIX) 40 MG tablet Take 1 tablet (40 mg total) by mouth daily. 30 tablet 3   No current facility-administered medications on file prior to visit.    No Known Allergies  Family History  Problem Relation Age of Onset   Heart disease Father        CABG   Cancer Father        prostate   Heart attack Father    Hyperlipidemia Father    Hypertension Mother    Cancer Brother        kidney and prostate   Hyperlipidemia Brother    Diabetes Brother    Hyperlipidemia Brother    Kidney disease Brother     Social History   Socioeconomic History   Marital status: Divorced    Spouse name: Not on file   Number of children: Not on file   Years of education: Not on file   Highest education level: Not on file  Occupational History   Occupation: HVAC  Tobacco Use   Smoking status: Former Smoker    Types: Cigarettes     Smokeless tobacco: Never Used  Scientific laboratory technician Use: Never used  Substance and Sexual Activity   Alcohol use: Yes    Alcohol/week: 12.0 standard drinks    Types: 12 Cans of beer per week    Comment: weekends only   Drug use: No   Sexual activity: Yes  Other Topics Concern   Not on file  Social History Narrative   Not on file   Social Determinants of Health   Financial Resource Strain:    Difficulty of Paying Living Expenses: Not on file  Food Insecurity:    Worried About [place] in the Last Year: Not on file   Ran Out of Food in the Last Year: Not on file  Transportation Needs:    Lack of Transportation (Medical): Not on file   Lack of Transportation (Non-Medical): Not on file  Physical Activity:    Days of Exercise per Week: Not on file   Minutes of Exercise per Session: Not on file  Stress:    Feeling of Stress : Not on file  Social Connections:    Frequency of Communication with Friends and Family: Not on file   Frequency of Social Gatherings with Friends and Family: Not on  file   Attends Religious Services: Not on file   Active Member of Clubs or Organizations: Not on file   Attends Club or Organization Meetings: Not on file   Marital Status: Not on file   Review of Systems - See HPI.  All other ROS are negative.  BP 140/84    Pulse 70    Temp 97.9 F (36.6 C) (Temporal)    Resp 16    Ht 6' 2.5" (1.892 m)    Wt 247 lb (112 kg)    SpO2 98%    BMI 31.29 kg/m   Physical Exam Vitals reviewed.  Constitutional:      Appearance: Normal appearance.  HENT:     Head: Normocephalic and atraumatic.     Right Ear: Tympanic membrane normal.     Left Ear: Tympanic membrane normal.  Cardiovascular:     Rate and Rhythm: Normal rate and regular rhythm.     Pulses: Normal pulses.     Heart sounds: Normal heart sounds.  Musculoskeletal:     Cervical back: Neck supple.  Neurological:     Mental Status: He is alert.     Recent  Results (from the past 2160 hour(s))  Cologuard     Status: None   Collection Time: 04/25/20 12:00 AM  Result Value Ref Range   Cologuard Negative Negative    Comment: [employer]  Cologuard     Status: None   Collection Time: 04/25/20 12:00 AM  Result Value Ref Range   Cologuard Negative Negative  CBC with Differential/Platelet     Status: None   Collection Time: 04/25/20 10:06 AM  Result Value Ref Range   WBC 7.4 4.0 - 10.5 K/uL   RBC 4.98 4.22 - 5.81 Mil/uL   Hemoglobin 16.5 13.0 - 17.0 g/dL   HCT 46.3 39 - 52 %   MCV 92.9 78.0 - 100.0 fl   MCHC 35.5 30.0 - 36.0 g/dL   RDW 13.1 11.5 - 15.5 %   Platelets 178.0 150 - 400 K/uL   Neutrophils Relative % 67.6 43 - 77 %   Lymphocytes Relative 22.2 12 - 46 %   Monocytes Relative 7.2 3 - 12 %   Eosinophils Relative 1.9 0 - 5 %   Basophils Relative 1.1 0 - 3 %   Neutro Abs 5.0 1.4 - 7.7 K/uL   Lymphs Abs 1.6 0.7 - 4.0 K/uL   Monocytes Absolute 0.5 0 - 1 K/uL   Eosinophils Absolute 0.1 0 - 0 K/uL   Basophils Absolute 0.1 0 - 0 K/uL  Comprehensive metabolic panel     Status: Abnormal   Collection Time: 04/25/20 10:06 AM  Result Value Ref Range   Sodium 136 135 - 145 mEq/L   Potassium 3.6 3.5 - 5.1 mEq/L   Chloride 99 96 - 112 mEq/L   CO2 26 19 - 32 mEq/L   Glucose, Bld 248 (H) 70 - 99 mg/dL   BUN 16 6 - 23 mg/dL   Creatinine, Ser 1.12 0.40 - 1.50 mg/dL   Total Bilirubin 0.8 0.2 - 1.2 mg/dL   Alkaline Phosphatase 86 39 - 117 U/L   AST 18 0 - 37 U/L   ALT 79 (H) 0 - 53 U/L   Total Protein 6.7 6.0 - 8.3 g/dL   Albumin 4.5 3.5 - 5.2 g/dL   GFR 67.29 >60.00 mL/min   Calcium 9.7 8.4 - 10.5 mg/dL  Hemoglobin A1c     Status: Abnormal   Collection Time:  04/25/20 10:06 AM  Result Value Ref Range   Hgb A1c MFr Bld 8.6 (H) 4.6 - 6.5 %    Comment: Glycemic Control Guidelines for People with Diabetes:Non Diabetic:  <6%Goal of Therapy: <7%Additional Action Suggested:  >8%   Lipid panel     Status: Abnormal   Collection Time:  04/25/20 10:06 AM  Result Value Ref Range   Cholesterol 249 (H) 0 - 200 mg/dL    Comment: ATP III Classification       Desirable:  < 200 mg/dL               Borderline High:  200 - 239 mg/dL          High:  > = 240 mg/dL   Triglycerides (H) 0 - 149 mg/dL    486.0 Triglyceride is over 400; calculations on Lipids are invalid.    Comment: Normal:  <150 mg/dLBorderline High:  150 - 199 mg/dL   HDL 43.10 >39.00 mg/dL   Total CHOL/HDL Ratio 6     Comment:                Men          Women1/2 Average Risk     3.4          3.3Average Risk          5.0          4.42X Average Risk          9.6          7.13X Average Risk          15.0          11.0                      Hepatitis C Antibody     Status: None   Collection Time: 04/25/20 10:06 AM  Result Value Ref Range   Hepatitis C Ab NON-REACTIVE NON-REACTI   SIGNAL TO CUT-OFF 0.00 <1.00    Comment: . HCV antibody was non-reactive. There is no laboratory  evidence of HCV infection. . In most cases, no further action is required. However, if recent HCV exposure is suspected, a test for HCV RNA (test code [phone]) is suggested. . For additional information please refer to http://education.[facility].com/faq/FAQ22v1 (This link is being provided for informational/ educational purposes only.) .   LDL cholesterol, direct     Status: None   Collection Time: 04/25/20 10:06 AM  Result Value Ref Range   Direct LDL 121.0 mg/dL    Comment: Optimal:  <100 mg/dLNear or Above Optimal:  100-129 mg/dLBorderline High:  130-159 mg/dLHigh:  160-189 mg/dLVery High:  >190 mg/dL   Assessment/Plan: 1. Essential hypertension BP improved but still above goal.  Asymptomatic.  Do think uncontrolled sleep apnea is contributing to this.  His new CPAP machine is on backorder and will not arrive for the next 6 weeks.  He is to continue DASH diet.  Continue losartan 100 mg daily.  Will increase amlodipine to 7.5 mg daily.  Patient has been instructed to get a new home  blood pressure cuff to check measurements daily.  He is to record these and contact me in 2 weeks with an average.  This way we can make further adjustments.  This visit occurred during the SARS-CoV-2 public health emergency.  Safety protocols were in place, including screening questions prior to the visit, additional usage of staff PPE, and extensive cleaning of exam room while observing appropriate  contact time as indicated for disinfecting solutions.     Leeanne Rio, PA-C

## 2020-05-31 NOTE — Patient Instructions (Signed)
Please start the new dose of amlodipine -- 7.5 mg daily (1.5 tablets). I have sent in a new prescription for you to cover the new daily amount. Continue low salt diet. Get a new BP cuff to use at home to check BP daily. Record these so we can monitor. Send me a message in a couple of weeks to let me know how BP is running.    DASH Eating Plan DASH stands for "Dietary Approaches to Stop Hypertension." The DASH eating plan is a healthy eating plan that has been shown to reduce high blood pressure (hypertension). It may also reduce your risk for type 2 diabetes, heart disease, and stroke. The DASH eating plan may also help with weight loss. What are tips for following this plan?  General guidelines  Avoid eating more than 2,300 mg (milligrams) of salt (sodium) a day. If you have hypertension, you may need to reduce your sodium intake to 1,500 mg a day.  Limit alcohol intake to no more than 1 drink a day for nonpregnant women and 2 drinks a day for men. One drink equals 12 oz of beer, 5 oz of wine, or 1 oz of hard liquor.  Work with your health care provider to maintain a healthy body weight or to lose weight. Ask what an ideal weight is for you.  Get at least 30 minutes of exercise that causes your heart to beat faster (aerobic exercise) most days of the week. Activities may include walking, swimming, or biking.  Work with your health care provider or diet and nutrition specialist (dietitian) to adjust your eating plan to your individual calorie needs. Reading food labels   Check food labels for the amount of sodium per serving. Choose foods with less than 5 percent of the Daily Value of sodium. Generally, foods with less than 300 mg of sodium per serving fit into this eating plan.  To find whole grains, look for the word "whole" as the first word in the ingredient list. Shopping  Buy products labeled as "low-sodium" or "no salt added."  Buy fresh foods. Avoid canned foods and premade  or frozen meals. Cooking  Avoid adding salt when cooking. Use salt-free seasonings or herbs instead of table salt or sea salt. Check with your health care provider or pharmacist before using salt substitutes.  Do not fry foods. Cook foods using healthy methods such as baking, boiling, grilling, and broiling instead.  Cook with heart-healthy oils, such as olive, canola, soybean, or sunflower oil. Meal planning  Eat a balanced diet that includes: ? 5 or more servings of fruits and vegetables each day. At each meal, try to fill half of your plate with fruits and vegetables. ? Up to 6-8 servings of whole grains each day. ? Less than 6 oz of lean meat, poultry, or fish each day. A 3-oz serving of meat is about the same size as a deck of cards. One egg equals 1 oz. ? 2 servings of low-fat dairy each day. ? A serving of nuts, seeds, or beans 5 times each week. ? Heart-healthy fats. Healthy fats called Omega-3 fatty acids are found in foods such as flaxseeds and coldwater fish, like sardines, salmon, and mackerel.  Limit how much you eat of the following: ? Canned or prepackaged foods. ? Food that is high in trans fat, such as fried foods. ? Food that is high in saturated fat, such as fatty meat. ? Sweets, desserts, sugary drinks, and other foods with added sugar. ?  Full-fat dairy products.  Do not salt foods before eating.  Try to eat at least 2 vegetarian meals each week.  Eat more home-cooked food and less restaurant, buffet, and fast food.  When eating at a restaurant, ask that your food be prepared with less salt or no salt, if possible. What foods are recommended? The items listed may not be a complete list. Talk with your dietitian about what dietary choices are best for you. Grains Whole-grain or whole-wheat bread. Whole-grain or whole-wheat pasta. Brown rice. [place]. Bulgur. Whole-grain and low-sodium cereals. Pita bread. Low-fat, low-sodium crackers. Whole-wheat flour  tortillas. Vegetables Fresh or frozen vegetables (raw, steamed, roasted, or grilled). Low-sodium or reduced-sodium tomato and vegetable juice. Low-sodium or reduced-sodium tomato sauce and tomato paste. Low-sodium or reduced-sodium canned vegetables. Fruits All fresh, dried, or frozen fruit. Canned fruit in natural juice (without added sugar). Meat and other protein foods Skinless chicken or [place]. Ground chicken or [place]. Pork with fat trimmed off. Fish and seafood. Egg whites. Dried beans, peas, or lentils. Unsalted nuts, nut butters, and seeds. Unsalted canned beans. Lean cuts of beef with fat trimmed off. Low-sodium, lean deli meat. Dairy Low-fat (1%) or fat-free (skim) milk. Fat-free, low-fat, or reduced-fat cheeses. Nonfat, low-sodium ricotta or cottage cheese. Low-fat or nonfat yogurt. Low-fat, low-sodium cheese. Fats and oils Soft margarine without trans fats. Vegetable oil. Low-fat, reduced-fat, or light mayonnaise and salad dressings (reduced-sodium). Canola, safflower, olive, soybean, and sunflower oils. Avocado. Seasoning and other foods Herbs. Spices. Seasoning mixes without salt. Unsalted popcorn and pretzels. Fat-free sweets. What foods are not recommended? The items listed may not be a complete list. Talk with your dietitian about what dietary choices are best for you. Grains Baked goods made with fat, such as croissants, muffins, or some breads. Dry pasta or rice meal packs. Vegetables Creamed or fried vegetables. Vegetables in a cheese sauce. Regular canned vegetables (not low-sodium or reduced-sodium). Regular canned tomato sauce and paste (not low-sodium or reduced-sodium). Regular tomato and vegetable juice (not low-sodium or reduced-sodium). Angie Fava. Olives. Fruits Canned fruit in a light or heavy syrup. Fried fruit. Fruit in cream or butter sauce. Meat and other protein foods Fatty cuts of meat. Ribs. Fried meat. Berniece Salines. Sausage. Bologna and other processed lunch meats.  Salami. Fatback. Hotdogs. Bratwurst. Salted nuts and seeds. Canned beans with added salt. Canned or smoked fish. Whole eggs or egg yolks. Chicken or [place] with skin. Dairy Whole or 2% milk, cream, and half-and-half. Whole or full-fat cream cheese. Whole-fat or sweetened yogurt. Full-fat cheese. Nondairy creamers. Whipped toppings. Processed cheese and cheese spreads. Fats and oils Butter. Stick margarine. Lard. Shortening. Ghee. Bacon fat. Tropical oils, such as coconut, palm kernel, or palm oil. Seasoning and other foods Salted popcorn and pretzels. Onion salt, garlic salt, seasoned salt, table salt, and sea salt. Worcestershire sauce. Tartar sauce. Barbecue sauce. Teriyaki sauce. Soy sauce, including reduced-sodium. Steak sauce. Canned and packaged gravies. Fish sauce. Oyster sauce. Cocktail sauce. Horseradish that you find on the shelf. Ketchup. Mustard. Meat flavorings and tenderizers. Bouillon cubes. Hot sauce and Tabasco sauce. Premade or packaged marinades. Premade or packaged taco seasonings. Relishes. Regular salad dressings. Where to find more information:  National Heart, Lung, and [place]: https://wilson-eaton.com/  American Heart Association: www.heart.org Summary  The DASH eating plan is a healthy eating plan that has been shown to reduce high blood pressure (hypertension). It may also reduce your risk for type 2 diabetes, heart disease, and stroke.  With the DASH eating plan, you  should limit salt (sodium) intake to 2,300 mg a day. If you have hypertension, you may need to reduce your sodium intake to 1,500 mg a day.  When on the DASH eating plan, aim to eat more fresh fruits and vegetables, whole grains, lean proteins, low-fat dairy, and heart-healthy fats.  Work with your health care provider or diet and nutrition specialist (dietitian) to adjust your eating plan to your individual calorie needs. This information is not intended to replace advice given to you by your health  care provider. Make sure you discuss any questions you have with your health care provider. Document Revised: 08/29/2017 Document Reviewed: 09/09/2016 Elsevier Patient Education  2020 [employer].

## 2020-07-04 ENCOUNTER — Other Ambulatory Visit: Payer: Self-pay | Admitting: Physician Assistant

## 2020-07-13 ENCOUNTER — Other Ambulatory Visit: Payer: Self-pay | Admitting: Physician Assistant

## 2020-07-13 DIAGNOSIS — K219 Gastro-esophageal reflux disease without esophagitis: Secondary | ICD-10-CM

## 2020-07-26 ENCOUNTER — Institutional Professional Consult (permissible substitution): Payer: BC Managed Care – PPO | Admitting: Pulmonary Disease

## 2020-08-18 ENCOUNTER — Institutional Professional Consult (permissible substitution): Payer: BC Managed Care – PPO | Admitting: Pulmonary Disease

## 2020-08-22 ENCOUNTER — Other Ambulatory Visit: Payer: Self-pay | Admitting: Physician Assistant

## 2020-09-14 ENCOUNTER — Ambulatory Visit (INDEPENDENT_AMBULATORY_CARE_PROVIDER_SITE_OTHER): Payer: BC Managed Care – PPO | Admitting: Physician Assistant

## 2020-09-14 ENCOUNTER — Other Ambulatory Visit: Payer: Self-pay

## 2020-09-14 ENCOUNTER — Encounter: Payer: Self-pay | Admitting: Physician Assistant

## 2020-09-14 ENCOUNTER — Institutional Professional Consult (permissible substitution): Payer: BC Managed Care – PPO | Admitting: Pulmonary Disease

## 2020-09-14 VITALS — BP 144/86 | HR 85 | Temp 98.5°F | Ht 74.5 in | Wt 249.0 lb

## 2020-09-14 DIAGNOSIS — I10 Essential (primary) hypertension: Secondary | ICD-10-CM

## 2020-09-14 DIAGNOSIS — M722 Plantar fascial fibromatosis: Secondary | ICD-10-CM | POA: Diagnosis not present

## 2020-09-14 DIAGNOSIS — E119 Type 2 diabetes mellitus without complications: Secondary | ICD-10-CM

## 2020-09-14 DIAGNOSIS — E785 Hyperlipidemia, unspecified: Secondary | ICD-10-CM | POA: Diagnosis not present

## 2020-09-14 DIAGNOSIS — E1169 Type 2 diabetes mellitus with other specified complication: Secondary | ICD-10-CM | POA: Diagnosis not present

## 2020-09-14 MED ORDER — HYDROCHLOROTHIAZIDE 25 MG PO TABS: 25.0000 mg | ORAL_TABLET | Freq: Every day | ORAL | 3 refills | Status: DC

## 2020-09-14 MED ORDER — ATORVASTATIN CALCIUM 20 MG PO TABS: 20.0000 mg | ORAL_TABLET | Freq: Every day | ORAL | 3 refills | Status: DC

## 2020-09-14 MED ORDER — METFORMIN HCL 500 MG PO TABS: 500.0000 mg | ORAL_TABLET | Freq: Two times a day (BID) | ORAL | 3 refills | Status: DC

## 2020-09-14 NOTE — Progress Notes (Signed)
Patient presents to clinic today to discuss potential side effect of his amlodipine. Patient notes her recently had an evaluation with his dentist who noted gingival hyperplasia and gingivitis and was told this was likely 2/2 to his amlodipine. Wanted him to have evaluation with his PCP. Notes he did receive a deep cleaning with resolution of gingival irritation. Has stopped his amlodipine since visit with dentist. Is still taking his losartan 100 mg daily. Patient denies chest pain, palpitations, lightheadedness, dizziness, vision changes or frequent headaches.  Patient also notes bilateral heel pain with L > R. Worse with first few steps after rest, improves with ambulation but does not go away. Notes mild soreness even with rest. Has gotten some new shoes with better support that he notes is helping but symptoms are not fully resolved.   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 135 tablet 0   losartan (COZAAR) 100 MG tablet Take 1 tablet (100 mg total) by mouth daily. 90 tablet 1   pantoprazole (PROTONIX) 40 MG tablet TAKE 1 TABLET BY MOUTH EVERY DAY 90 tablet 1   No current facility-administered medications on file prior to visit.    No Known Allergies  Family History  Problem Relation Age of Onset   Heart disease Father        CABG   Cancer Father        prostate   Heart attack Father    Hyperlipidemia Father    Hypertension Mother    Cancer Brother        kidney and prostate   Hyperlipidemia Brother    Diabetes Brother    Hyperlipidemia Brother    Kidney disease Brother     Social History   Socioeconomic History    Marital status: Divorced    Spouse name: Not on file   Number of children: Not on file   Years of education: Not on file   Highest education level: Not on file  Occupational History   Occupation: HVAC  Tobacco Use   Smoking status: Former Smoker    Types: Cigarettes   Smokeless tobacco: Never Used  Scientific laboratory technician Use: Never used  Substance and Sexual Activity   Alcohol use: Yes    Alcohol/week: 12.0 standard drinks    Types: 12 Cans of beer per week    Comment: weekends only   Drug use: No   Sexual activity: Yes  Other Topics Concern   Not on file  Social History Narrative   Not on file   Social Determinants of Health   Financial Resource Strain: Not on file  Food Insecurity: Not on file  Transportation Needs: Not on file  Physical Activity: Not on file  Stress: Not on file  Social Connections: Not on file   Review of Systems - See HPI.  All other ROS are negative.  Ht 6' 2.5" (1.892 m)    Wt 249 lb (112.9 kg)    BMI 31.54 kg/m   Physical Exam Vitals reviewed.  Constitutional:      Appearance: Normal appearance.  HENT:  Head: Normocephalic and atraumatic.  Cardiovascular:     Rate and Rhythm: Normal rate and regular rhythm.     Pulses: Normal pulses.     Heart sounds: Normal heart sounds.  Pulmonary:     Effort: Pulmonary effort is normal.  Musculoskeletal:     Cervical back: Neck supple.  Feet:     Comments: Pain and soreness with palpation over plantar fascia. Sensation intact. Pulses 2+ and equal bilaterally. No edema noted on examination.  Neurological:     General: No focal deficit present.     Mental Status: He is alert and oriented to person, place, and time.  Psychiatric:        Mood and Affect: Mood normal.    Assessment/Plan: 1. Primary hypertension Side effect with CCB -- gingivla hyperplasia leading to significant gingival swelling and inflammation. Amlodipine d/c'd. Discussed medication options. He would like to restart  HCTZ now that he does a better job of hydrating and not skipping meals. Will continue losartan at 100 mg daily. Restart HCTZ at 25 mg daily. Increase dietary intake of potassium. Daily MTV. Follow-up 2 weeks for reassessment.  - hydrochlorothiazide (HYDRODIURIL) 25 MG tablet; Take 1 tablet (25 mg total) by mouth daily.  Dispense: 90 tablet; Refill: 3  2. Type 2 diabetes mellitus without complication, without long-term current use of insulin (HCC) Previously declined medication. Discussed need for treatment. Foot exam updated -- extremities are neurovascularly intact. Will start Metformin 500 mg BID, Atorvastatin 20 mg daily. Dietary recommendations reviewed. Follow-up scheduled.  - metFORMIN (GLUCOPHAGE) 500 MG tablet; Take 1 tablet (500 mg total) by mouth 2 (two) times daily with a meal.  Dispense: 180 tablet; Refill: 3 - atorvastatin (LIPITOR) 20 MG tablet; Take 1 tablet (20 mg total) by mouth daily.  Dispense: 90 tablet; Refill: 3  3. Hyperlipidemia associated with type 2 diabetes mellitus (HCC) Dietary and exercise recommendations reviewed. Start Atorvastatin at 20 mg daily. Follow-up 8 weeks.   4. Plantar fasciitis, bilateral Continue use of supportive footwear. Start cold can exercises. PT exercises reviewed and handout given. Referral to Podiatry if symptoms are not resolving.   This visit occurred during the SARS-CoV-2 public health emergency.  Safety protocols were in place, including screening questions prior to the visit, additional usage of staff PPE, and extensive cleaning of exam room while observing appropriate contact time as indicated for disinfecting solutions.     Leeanne Rio, PA-C

## 2020-09-14 NOTE — Patient Instructions (Signed)
Please start the cold can exercises we discussed. Also start the stretches listed below.  Continue wearing supportive footwear.  Start the Metformin and Atorvastatin as directed. We will follow-up in 8 weeks to reassess levels, etc.   Please stop the amlodipine.  Continue the losartan and start the HCTZ as directed.   Let's follow-up via video visit in 2 weeks to reassess BP.  Check once daily at home and record.    DASH Eating Plan DASH stands for "Dietary Approaches to Stop Hypertension." The DASH eating plan is a healthy eating plan that has been shown to reduce high blood pressure (hypertension). It may also reduce your risk for type 2 diabetes, heart disease, and stroke. The DASH eating plan may also help with weight loss. What are tips for following this plan?  General guidelines  Avoid eating more than 2,300 mg (milligrams) of salt (sodium) a day. If you have hypertension, you may need to reduce your sodium intake to 1,500 mg a day.  Limit alcohol intake to no more than 1 drink a day for nonpregnant women and 2 drinks a day for men. One drink equals 12 oz of beer, 5 oz of wine, or 1 oz of hard liquor.  Work with your health care provider to maintain a healthy body weight or to lose weight. Ask what an ideal weight is for you.  Get at least 30 minutes of exercise that causes your heart to beat faster (aerobic exercise) most days of the week. Activities may include walking, swimming, or biking.  Work with your health care provider or diet and nutrition specialist (dietitian) to adjust your eating plan to your individual calorie needs. Reading food labels   Check food labels for the amount of sodium per serving. Choose foods with less than 5 percent of the Daily Value of sodium. Generally, foods with less than 300 mg of sodium per serving fit into this eating plan.  To find whole grains, look for the word "whole" as the first word in the ingredient list. Shopping  Buy  products labeled as "low-sodium" or "no salt added."  Buy fresh foods. Avoid canned foods and premade or frozen meals. Cooking  Avoid adding salt when cooking. Use salt-free seasonings or herbs instead of table salt or sea salt. Check with your health care provider or pharmacist before using salt substitutes.  Do not fry foods. Cook foods using healthy methods such as baking, boiling, grilling, and broiling instead.  Cook with heart-healthy oils, such as olive, canola, soybean, or sunflower oil. Meal planning  Eat a balanced diet that includes: ? 5 or more servings of fruits and vegetables each day. At each meal, try to fill half of your plate with fruits and vegetables. ? Up to 6-8 servings of whole grains each day. ? Less than 6 oz of lean meat, poultry, or fish each day. A 3-oz serving of meat is about the same size as a deck of cards. One egg equals 1 oz. ? 2 servings of low-fat dairy each day. ? A serving of nuts, seeds, or beans 5 times each week. ? Heart-healthy fats. Healthy fats called Omega-3 fatty acids are found in foods such as flaxseeds and coldwater fish, like sardines, salmon, and mackerel.  Limit how much you eat of the following: ? Canned or prepackaged foods. ? Food that is high in trans fat, such as fried foods. ? Food that is high in saturated fat, such as fatty meat. ? Sweets, desserts, sugary drinks, and  other foods with added sugar. ? Full-fat dairy products.  Do not salt foods before eating.  Try to eat at least 2 vegetarian meals each week.  Eat more home-cooked food and less restaurant, buffet, and fast food.  When eating at a restaurant, ask that your food be prepared with less salt or no salt, if possible. What foods are recommended? The items listed may not be a complete list. Talk with your dietitian about what dietary choices are best for you. Grains Whole-grain or whole-wheat bread. Whole-grain or whole-wheat pasta. Brown rice. [place].  Bulgur. Whole-grain and low-sodium cereals. Pita bread. Low-fat, low-sodium crackers. Whole-wheat flour tortillas. Vegetables Fresh or frozen vegetables (raw, steamed, roasted, or grilled). Low-sodium or reduced-sodium tomato and vegetable juice. Low-sodium or reduced-sodium tomato sauce and tomato paste. Low-sodium or reduced-sodium canned vegetables. Fruits All fresh, dried, or frozen fruit. Canned fruit in natural juice (without added sugar). Meat and other protein foods Skinless chicken or [place]. Ground chicken or [place]. Pork with fat trimmed off. Fish and seafood. Egg whites. Dried beans, peas, or lentils. Unsalted nuts, nut butters, and seeds. Unsalted canned beans. Lean cuts of beef with fat trimmed off. Low-sodium, lean deli meat. Dairy Low-fat (1%) or fat-free (skim) milk. Fat-free, low-fat, or reduced-fat cheeses. Nonfat, low-sodium ricotta or cottage cheese. Low-fat or nonfat yogurt. Low-fat, low-sodium cheese. Fats and oils Soft margarine without trans fats. Vegetable oil. Low-fat, reduced-fat, or light mayonnaise and salad dressings (reduced-sodium). Canola, safflower, olive, soybean, and sunflower oils. Avocado. Seasoning and other foods Herbs. Spices. Seasoning mixes without salt. Unsalted popcorn and pretzels. Fat-free sweets. What foods are not recommended? The items listed may not be a complete list. Talk with your dietitian about what dietary choices are best for you. Grains Baked goods made with fat, such as croissants, muffins, or some breads. Dry pasta or rice meal packs. Vegetables Creamed or fried vegetables. Vegetables in a cheese sauce. Regular canned vegetables (not low-sodium or reduced-sodium). Regular canned tomato sauce and paste (not low-sodium or reduced-sodium). Regular tomato and vegetable juice (not low-sodium or reduced-sodium). Angie Fava. Olives. Fruits Canned fruit in a light or heavy syrup. Fried fruit. Fruit in cream or butter sauce. Meat and other  protein foods Fatty cuts of meat. Ribs. Fried meat. Berniece Salines. Sausage. Bologna and other processed lunch meats. Salami. Fatback. Hotdogs. Bratwurst. Salted nuts and seeds. Canned beans with added salt. Canned or smoked fish. Whole eggs or egg yolks. Chicken or [place] with skin. Dairy Whole or 2% milk, cream, and half-and-half. Whole or full-fat cream cheese. Whole-fat or sweetened yogurt. Full-fat cheese. Nondairy creamers. Whipped toppings. Processed cheese and cheese spreads. Fats and oils Butter. Stick margarine. Lard. Shortening. Ghee. Bacon fat. Tropical oils, such as coconut, palm kernel, or palm oil. Seasoning and other foods Salted popcorn and pretzels. Onion salt, garlic salt, seasoned salt, table salt, and sea salt. Worcestershire sauce. Tartar sauce. Barbecue sauce. Teriyaki sauce. Soy sauce, including reduced-sodium. Steak sauce. Canned and packaged gravies. Fish sauce. Oyster sauce. Cocktail sauce. Horseradish that you find on the shelf. Ketchup. Mustard. Meat flavorings and tenderizers. Bouillon cubes. Hot sauce and Tabasco sauce. Premade or packaged marinades. Premade or packaged taco seasonings. Relishes. Regular salad dressings. Where to find more information:  National Heart, Lung, and [place]: https://wilson-eaton.com/  American Heart Association: www.heart.org Summary  The DASH eating plan is a healthy eating plan that has been shown to reduce high blood pressure (hypertension). It may also reduce your risk for type 2 diabetes, heart disease, and stroke.  With the DASH eating plan, you should limit salt (sodium) intake to 2,300 mg a day. If you have hypertension, you may need to reduce your sodium intake to 1,500 mg a day.  When on the DASH eating plan, aim to eat more fresh fruits and vegetables, whole grains, lean proteins, low-fat dairy, and heart-healthy fats.  Work with your health care provider or diet and nutrition specialist (dietitian) to adjust your eating plan to your  individual calorie needs. This information is not intended to replace advice given to you by your health care provider. Make sure you discuss any questions you have with your health care provider. Document Revised: 08/29/2017 Document Reviewed: 09/09/2016 Elsevier Patient Education  [employer].   Plantar Fasciitis Rehab Ask your health care provider which exercises are safe for you. Do exercises exactly as told by your health care provider and adjust them as directed. It is normal to feel mild stretching, pulling, tightness, or discomfort as you do these exercises. Stop right away if you feel sudden pain or your pain gets worse. Do not begin these exercises until told by your health care provider. Stretching and range-of-motion exercises These exercises warm up your muscles and joints and improve the movement and flexibility of your foot. These exercises also help to relieve pain. Plantar fascia stretch  1. Sit with your left / right leg crossed over your opposite knee. 2. Hold your heel with one hand with that thumb near your arch. With your other hand, hold your toes and gently pull them back toward the top of your foot. You should feel a stretch on the bottom of your toes or your foot (plantar fascia) or both. 3. Hold this stretch for__________ seconds. 4. Slowly release your toes and return to the starting position. Repeat __________ times. Complete this exercise __________ times a day. Gastrocnemius stretch, standing This exercise is also called a calf (gastroc) stretch. It stretches the muscles in the back of the upper calf. 1. Stand with your hands against a wall. 2. Extend your left / right leg behind you, and bend your front knee slightly. 3. Keeping your heels on the floor and your back knee straight, shift your weight toward the wall. Do not arch your back. You should feel a gentle stretch in your upper left / right calf. 4. Hold this position for __________ seconds. Repeat  __________ times. Complete this exercise __________ times a day. Soleus stretch, standing This exercise is also called a calf (soleus) stretch. It stretches the muscles in the back of the lower calf. 1. Stand with your hands against a wall. 2. Extend your left / right leg behind you, and bend your front knee slightly. 3. Keeping your heels on the floor, bend your back knee and shift your weight slightly over your back leg. You should feel a gentle stretch deep in your lower calf. 4. Hold this position for __________ seconds. Repeat __________ times. Complete this exercise __________ times a day. Gastroc and soleus stretch, standing step This exercise stretches the muscles in the back of the lower leg. These muscles are in the upper calf (gastrocnemius) and the lower calf (soleus). 1. Stand with the ball of your left / right foot on a step. The ball of your foot is on the walking surface, right under your toes. 2. Keep your other foot firmly on the same step. 3. Hold on to the wall or a railing for balance. 4. Slowly lift your other foot, allowing your body weight  to press your left / right heel down over the edge of the step. You should feel a stretch in your left / right calf. 5. Hold this position for __________ seconds. 6. Return both feet to the step. 7. Repeat this exercise with a slight bend in your left / right knee. Repeat __________ times with your left / right knee straight and __________ times with your left / right knee bent. Complete this exercise __________ times a day. Balance exercise This exercise builds your balance and strength control of your arch to help take pressure off your plantar fascia. Single leg stand If this exercise is too easy, you can try it with your eyes closed or while standing on a pillow. 1. Without shoes, stand near a railing or in a doorway. You may hold on to the railing or door frame as needed. 2. Stand on your left / right foot. Keep your big toe down  on the floor and try to keep your arch lifted. Do not let your foot roll inward. 3. Hold this position for __________ seconds. Repeat __________ times. Complete this exercise __________ times a day. This information is not intended to replace advice given to you by your health care provider. Make sure you discuss any questions you have with your health care provider. Document Revised: 01/07/2019 Document Reviewed: 07/15/2018 Elsevier Patient Education  [place].

## 2020-09-28 ENCOUNTER — Telehealth: Payer: BC Managed Care – PPO | Admitting: Physician Assistant

## 2020-10-24 ENCOUNTER — Other Ambulatory Visit: Payer: Self-pay | Admitting: Physician Assistant

## 2020-10-24 DIAGNOSIS — I10 Essential (primary) hypertension: Secondary | ICD-10-CM

## 2020-10-31 ENCOUNTER — Institutional Professional Consult (permissible substitution): Payer: BC Managed Care – PPO | Admitting: Pulmonary Disease

## 2020-11-14 ENCOUNTER — Other Ambulatory Visit: Payer: Self-pay | Admitting: Physician Assistant

## 2021-06-28 ENCOUNTER — Telehealth: Payer: Self-pay

## 2021-06-28 NOTE — Telephone Encounter (Signed)
Pt needs refill on losartan (COZAAR) 100 MG tablet   [id number] [place], [place] - [address]  [place], [place] [address]   Pt has TOC at [facility] in Nov but needs a refill until then he was a patient of Elyn Aquas   Pt call back [phone]

## 2021-06-29 ENCOUNTER — Other Ambulatory Visit: Payer: Self-pay | Admitting: Family

## 2021-06-29 ENCOUNTER — Other Ambulatory Visit: Payer: Self-pay

## 2021-06-29 DIAGNOSIS — I10 Essential (primary) hypertension: Secondary | ICD-10-CM

## 2021-06-29 MED ORDER — LOSARTAN POTASSIUM 100 MG PO TABS: 100.0000 mg | ORAL_TABLET | Freq: Every day | ORAL | 0 refills | Status: DC

## 2021-06-29 MED ORDER — LOSARTAN POTASSIUM 100 MG PO TABS
100.0000 mg | ORAL_TABLET | Freq: Every day | ORAL | 0 refills | Status: DC
Start: 2021-06-29 — End: 2021-08-06

## 2021-06-29 NOTE — Telephone Encounter (Signed)
[name],  Just FYI. Patient has a [name]. He was a patient of Elyn Aquas. I have filled the Rx and sent it to patient pharmacy.

## 2021-07-05 DIAGNOSIS — C44319 Basal cell carcinoma of skin of other parts of face: Secondary | ICD-10-CM | POA: Diagnosis not present

## 2021-08-06 ENCOUNTER — Other Ambulatory Visit: Payer: Self-pay

## 2021-08-06 ENCOUNTER — Encounter (HOSPITAL_BASED_OUTPATIENT_CLINIC_OR_DEPARTMENT_OTHER): Payer: Self-pay | Admitting: Nurse Practitioner

## 2021-08-06 ENCOUNTER — Ambulatory Visit (INDEPENDENT_AMBULATORY_CARE_PROVIDER_SITE_OTHER): Payer: BC Managed Care – PPO | Admitting: Nurse Practitioner

## 2021-08-06 VITALS — BP 160/92 | HR 75 | Ht 75.0 in | Wt 242.0 lb

## 2021-08-06 DIAGNOSIS — Z125 Encounter for screening for malignant neoplasm of prostate: Secondary | ICD-10-CM | POA: Diagnosis not present

## 2021-08-06 DIAGNOSIS — G4733 Obstructive sleep apnea (adult) (pediatric): Secondary | ICD-10-CM

## 2021-08-06 DIAGNOSIS — Z23 Encounter for immunization: Secondary | ICD-10-CM

## 2021-08-06 DIAGNOSIS — I10 Essential (primary) hypertension: Secondary | ICD-10-CM | POA: Diagnosis not present

## 2021-08-06 DIAGNOSIS — E119 Type 2 diabetes mellitus without complications: Secondary | ICD-10-CM | POA: Diagnosis not present

## 2021-08-06 DIAGNOSIS — C61 Malignant neoplasm of prostate: Secondary | ICD-10-CM

## 2021-08-06 DIAGNOSIS — Z113 Encounter for screening for infections with a predominantly sexual mode of transmission: Secondary | ICD-10-CM

## 2021-08-06 DIAGNOSIS — E785 Hyperlipidemia, unspecified: Secondary | ICD-10-CM

## 2021-08-06 DIAGNOSIS — Z0000 Encounter for general adult medical examination without abnormal findings: Secondary | ICD-10-CM

## 2021-08-06 DIAGNOSIS — E1169 Type 2 diabetes mellitus with other specified complication: Secondary | ICD-10-CM | POA: Diagnosis not present

## 2021-08-06 LAB — COMPREHENSIVE METABOLIC PANEL
ALT: 52 IU/L — ABNORMAL HIGH (ref 0–44)
AST: 24 IU/L (ref 0–40)
Albumin/Globulin Ratio: 3 — ABNORMAL HIGH (ref 1.2–2.2)
Albumin: 5.1 g/dL — ABNORMAL HIGH (ref 3.8–4.9)
Alkaline Phosphatase: 92 IU/L (ref 44–121)
BUN/Creatinine Ratio: 12 (ref 9–20)
BUN: 13 mg/dL (ref 6–24)
Bilirubin Total: 0.5 mg/dL (ref 0.0–1.2)
CO2: 26 mmol/L (ref 20–29)
Calcium: 9.9 mg/dL (ref 8.7–10.2)
Chloride: 101 mmol/L (ref 96–106)
Creatinine, Ser: 1.12 mg/dL (ref 0.76–1.27)
Globulin, Total: 1.7 g/dL (ref 1.5–4.5)
Glucose: 127 mg/dL — ABNORMAL HIGH (ref 70–99)
Potassium: 4 mmol/L (ref 3.5–5.2)
Sodium: 141 mmol/L (ref 134–144)
Total Protein: 6.8 g/dL (ref 6.0–8.5)
eGFR: 76 mL/min/{1.73_m2} (ref >59)

## 2021-08-06 LAB — LIPID PANEL
Chol/HDL Ratio: 3.1 ratio (ref 0.0–5.0)
Cholesterol, Total: 193 mg/dL (ref 100–199)
HDL: 62 mg/dL (ref >39)
LDL Chol Calc (NIH): 106 mg/dL — ABNORMAL HIGH (ref 0–99)
Triglycerides: 143 mg/dL (ref 0–149)
VLDL Cholesterol Cal: 25 mg/dL (ref 5–40)

## 2021-08-06 LAB — CBC WITH DIFFERENTIAL/PLATELET
Basophils Absolute: 0 10*3/uL (ref 0.0–0.2)
Basos: 1 %
EOS (ABSOLUTE): 0.2 10*3/uL (ref 0.0–0.4)
Eos: 5 %
Hematocrit: 44.6 % (ref 37.5–51.0)
Hemoglobin: 15.4 g/dL (ref 13.0–17.7)
Immature Grans (Abs): 0 10*3/uL (ref 0.0–0.1)
Immature Granulocytes: 0 %
Lymphocytes Absolute: 1.4 10*3/uL (ref 0.7–3.1)
Lymphs: 26 %
MCH: 30.6 pg (ref 26.6–33.0)
MCHC: 34.5 g/dL (ref 31.5–35.7)
MCV: 89 fL (ref 79–97)
Monocytes Absolute: 0.5 10*3/uL (ref 0.1–0.9)
Monocytes: 9 %
Neutrophils Absolute: 3.1 10*3/uL (ref 1.4–7.0)
Neutrophils: 59 %
Platelets: 200 10*3/uL (ref 150–450)
RBC: 5.03 x10E6/uL (ref 4.14–5.80)
RDW: 13 % (ref 11.6–15.4)
WBC: 5.2 10*3/uL (ref 3.4–10.8)

## 2021-08-06 LAB — HEMOGLOBIN A1C
Est. average glucose Bld gHb Est-mCnc: 103 mg/dL
Hgb A1c MFr Bld: 5.2 % (ref 4.8–5.6)

## 2021-08-06 MED ORDER — HYDROCHLOROTHIAZIDE 25 MG PO TABS: 25.0000 mg | ORAL_TABLET | Freq: Every day | ORAL | 3 refills | Status: DC

## 2021-08-06 MED ORDER — METFORMIN HCL 500 MG PO TABS: 500.0000 mg | ORAL_TABLET | Freq: Two times a day (BID) | ORAL | 3 refills | Status: DC

## 2021-08-06 MED ORDER — LOSARTAN POTASSIUM 100 MG PO TABS: 100.0000 mg | ORAL_TABLET | Freq: Every day | ORAL | 3 refills | Status: DC

## 2021-08-06 NOTE — Assessment & Plan Note (Addendum)
Review of current and past medical history, social history, medication, and family history.  Review of care gaps and health maintenance recommendations.  Records from recent providers to be requested if not available in Chart Review or Care Everywhere.  Recommendations for health maintenance, diet, and exercise provided.  Labs today: CBC, CMP, LIPID, A1c, PSA HM Recommendations: Will review record to determine: flu shot given today CPE due: Will plan for next summer

## 2021-08-06 NOTE — Assessment & Plan Note (Signed)
Chronic.  Patient uses CPAP and reports tolerating well with no day time fatigue.  Last sleep study several years ago- patient declined recommendation for re-evaluation of this today.  Will obtain labs today. In setting of HTN and BMI >27, patient may benefit from re-evaluation with pulm or neuro- will plan to discuss this in the future.

## 2021-08-06 NOTE — Assessment & Plan Note (Signed)
No labs since summer 2021.  Will obtain labs today for further evaluation and recommendations Patient has been working on diet and exercise.  He declines statin therapy.  BP not under optimal control at this time Will address and make changes to plan of care based on labs.

## 2021-08-06 NOTE — Assessment & Plan Note (Signed)
No recent lipids for evaluation Patient declines statin treatment at this time Will obtain labs today for evaluation- he is fasting Recommend continued diet and exercise therapy, diabetes, and BP control Discussed risks of elevated lipids in the setting of DM and CVD and recommendations for medication for prevention.  Will re-evaluate levels once labs received.

## 2021-08-06 NOTE — Patient Instructions (Signed)
Thank you for choosing [place] at [facility] for your Primary Care needs. I am excited for the opportunity to partner with you to meet your health care goals. It was a pleasure meeting you today!  Recommendations from today's visit: We will get some labs today to look and see where you are with your blood sugar and your cholesterol.  Keep working on the diet and exercise- these are the best things you can do for your blood sugar, weight, and cholesterol.   Information on diet, exercise, and health maintenance recommendations are listed below. This is information to help you be sure you are on track for optimal health and monitoring.   Please look over this and let us know if you have any questions or if you have completed any of the health maintenance outside of [place] so that we can be sure your records are up to date.  ___________________________________________________________ About Me: I am an Adult-Geriatric Nurse Practitioner with a background in caring for patients for more than 20 years with a strong intensive care background. I provide primary care and sports medicine services to patients age 48 and older within this office. My education had a strong focus on caring for the older adult population, which I am passionate about. I am also the director of the APP Fellowship with [facility].   My desire is to provide you with the best service through preventive medicine and supportive care. I consider you a part of the medical team and value your input. I work diligently to ensure that you are heard and your needs are met in a safe and effective manner. I want you to feel comfortable with me as your provider and want you to know that your health concerns are important to me.  For your information, our office hours are: Monday, Tuesday, and Thursday 8:00 AM - 5:00 PM Wednesday and Friday 8:00 AM - 12:00 PM.   In my time away from the office I am teaching new APP's  within the system and am unavailable, but my partner, Dr. Burnard Bunting is in the office for emergent needs.   If you have questions or concerns, please call our office at [phone] or send [place] a MyChart message and we will respond as quickly as possible.  ____________________________________________________________ MyChart:  For all urgent or time sensitive needs we ask that you please call the office to avoid delays. Our number is [phone]. MyChart is not constantly monitored and due to the large volume of messages a day, replies may take up to 72 business hours.  MyChart Policy: MyChart allows for you to see your visit notes, after visit summary, provider recommendations, lab and tests results, make an appointment, request refills, and contact your provider or the office for non-urgent questions or concerns. Providers are seeing patients during normal business hours and do not have built in time to review MyChart messages.  We ask that you allow a minimum of 3 business days for responses to [employer]. For this reason, please do not send urgent requests through [place]. Please call the office at [phone]. New and ongoing conditions may require a visit. We have virtual and in person visit available for your convenience.  Complex MyChart concerns may require a visit. Your provider may request you schedule a virtual or in person visit to ensure we are providing the best care possible. MyChart messages sent after 11:00 AM on Friday will not be received by the provider until Monday morning.  Lab and Test Results: You will receive your lab and test results on MyChart as soon as they are completed and results have been sent by the lab or testing facility. Due to this service, you will receive your results BEFORE your provider.  I review lab and tests results each morning prior to seeing patients. Some results require collaboration with other providers to ensure you are receiving the most  appropriate care. For this reason, we ask that you please allow a minimum of 3-5 business days from the time the ALL results have been received for your provider to receive and review lab and test results and contact you about these.  Most lab and test result comments from the provider will be sent through [place]. Your provider may recommend changes to the plan of care, follow-up visits, repeat testing, ask questions, or request an office visit to discuss these results. You may reply directly to this message or call the office at [phone] to provide information for the provider or set up an appointment. In some instances, you will be called with test results and recommendations. Please let us know if this is preferred and we will make note of this in your chart to provide this for you.    If you have not heard a response to your lab or test results in 5 business days from all results returning to [place], please call the office to let us know. We ask that you please avoid calling prior to this time unless there is an emergent concern. Due to high call volumes, this can delay the resulting process.  After Hours: For all non-emergency after hours needs, please call the office at [phone] and select the option to reach the on-call provider service. On-call services are shared between multiple [place] offices and therefore it will not be possible to speak directly with your provider. On-call providers may provide medical advice and recommendations, but are unable to provide refills for maintenance medications.  For all emergency or urgent medical needs after normal business hours, we recommend that you seek care at the closest Urgent Care or Emergency Department to ensure appropriate treatment in a timely manner.  [facility] [place] has a 24 hour emergency room located on the ground floor for your convenience.   Urgent Concerns During the Business Day Providers are seeing patients  from 8AM to [place] with a busy schedule and are most often not able to respond to non-urgent calls until the end of the day or the next business day. If you should have URGENT concerns during the day, please call and speak to the nurse or schedule a same day appointment so that we can address your concern without delay.   Thank you, again, for choosing me as your health care partner. I appreciate your trust and look forward to learning more about you.   Benjamin Keeler, DNP, AGNP-c ___________________________________________________________  Health Maintenance Recommendations Screening Testing Mammogram Every 1 -2 years based on history and risk factors Starting at age 50 Pap Smear Ages 21-39 every 3 years Ages 13-65 every 5 years with HPV testing More frequent testing may be required based on results and history Colon Cancer Screening Every 1-10 years based on test performed, risk factors, and history Starting at age 72 Bone Density Screening Every 2-10 years based on history Starting at age 26 for women Recommendations for men differ based on medication usage, history, and risk factors AAA Screening One time ultrasound Men 92-83 years old who have  every smoked Lung Cancer Screening Low Dose Lung CT every 12 months Age 91-80 years with a 30 pack-year smoking history who still smoke or who have quit within the last 15 years  Screening Labs Routine  Labs: Complete Blood Count (CBC), Complete Metabolic Panel (CMP), Cholesterol (Lipid Panel) Every 6-12 months based on history and medications May be recommended more frequently based on current conditions or previous results Hemoglobin A1c Lab Every 3-12 months based on history and previous results Starting at age 80 or earlier with diagnosis of diabetes, high cholesterol, BMI >26, and/or risk factors Frequent monitoring for patients with diabetes to ensure blood sugar control Thyroid Panel (TSH w/ T3 & T4) Every 6 months based on  history, symptoms, and risk factors May be repeated more often if on medication HIV One time testing for all patients 53 and older May be repeated more frequently for patients with increased risk factors or exposure Hepatitis C One time testing for all patients 8 and older May be repeated more frequently for patients with increased risk factors or exposure Gonorrhea, Chlamydia Every 12 months for all sexually active persons 13-24 years Additional monitoring may be recommended for those who are considered high risk or who have symptoms PSA Men 16-62 years old with risk factors Additional screening may be recommended from age 65-69 based on risk factors, symptoms, and history  Vaccine Recommendations Tetanus Booster All adults every 10 years Flu Vaccine All patients 6 months and older every year COVID Vaccine All patients 12 years and older Initial dosing with booster May recommend additional booster based on age and health history HPV Vaccine 2 doses all patients age 22-26 Dosing may be considered for patients over 26 Shingles Vaccine (Shingrix) 2 doses all adults 75 years and older Pneumonia (Pneumovax 23) All adults 76 years and older May recommend earlier dosing based on health history Pneumonia (Prevnar 79) All adults 52 years and older Dosed 1 year after Pneumovax 23  Additional Screening, Testing, and Vaccinations may be recommended on an individualized basis based on family history, health history, risk factors, and/or exposure.  __________________________________________________________  Diet Recommendations for All Patients  I recommend that all patients maintain a diet low in saturated fats, carbohydrates, and cholesterol. While this can be challenging at first, it is not impossible and small changes can make big differences.  Things to try: Decreasing the amount of soda, sweet tea, and/or juice to one or less per day and replace with water While water is always  the first choice, if you do not like water you may consider adding a water additive without sugar to improve the taste other sugar free drinks Replace potatoes with a brightly colored vegetable at dinner Use healthy oils, such as canola oil or olive oil, instead of butter or hard margarine Limit your bread intake to two pieces or less a day Replace regular pasta with low carb pasta options Bake, broil, or grill foods instead of frying Monitor portion sizes  Eat smaller, more frequent meals throughout the day instead of large meals  An important thing to remember is, if you love foods that are not great for your health, you don't have to give them up completely. Instead, allow these foods to be a reward when you have done well. Allowing yourself to still have special treats every once in a while is a nice way to tell yourself thank you for working hard to keep yourself healthy.   Also remember that every day is a new day.  If you have a bad day and "fall off the wagon", you can still climb right back up and keep moving along on your journey!  We have resources available to help you!  Some websites that may be helpful include: www.http://carter.biz/  Www.VeryWellFit.com _____________________________________________________________  Activity Recommendations for All Patients  I recommend that all adults get at least 20 minutes of moderate physical activity that elevates your heart rate at least 5 days out of the week.  Some examples include: Walking or jogging at a pace that allows you to carry on a conversation Cycling (stationary bike or outdoors) Water aerobics Yoga Weight lifting Dancing If physical limitations prevent you from putting stress on your joints, exercise in a pool or seated in a chair are excellent options.  Do determine your MAXIMUM heart rate for activity: YOUR AGE - 220 = MAX HeartRate   Remember! Do not push yourself too hard.  Start slowly and build up your pace, speed,  weight, time in exercise, etc.  Allow your body to rest between exercise and get good sleep. You will need more water than normal when you are exerting yourself. Do not wait until you are thirsty to drink. Drink with a purpose of getting in at least 8, 8 ounce glasses of water a day plus more depending on how much you exercise and sweat.    If you begin to develop dizziness, chest pain, abdominal pain, jaw pain, shortness of breath, headache, vision changes, lightheadedness, or other concerning symptoms, stop the activity and allow your body to rest. If your symptoms are severe, seek emergency evaluation immediately. If your symptoms are concerning, but not severe, please let us know so that we can recommend further evaluation.

## 2021-08-06 NOTE — Assessment & Plan Note (Signed)
Chronic.  No alarm symptoms present today BP is elevated in office today- patient reports this is typical for him d/t White Coat rxn.  Will monitor labs today as it has been over a year since this has been done. Will refill medications today

## 2021-08-06 NOTE — Progress Notes (Signed)
Orma Render, DNP, AGNP-c Primary Care & Sports Medicine [address]  [place], [place] [address] [phone]  New patient visit   Patient: Benjamin Fletcher   DOB: Jun 13, 1961   60 y.o. Male  MRN: [id number] Visit Date: 08/06/2021  Patient Care Team: Daymien Goth, Coralee Pesa, NP as PCP - General (Nurse Practitioner) Irine Seal, MD as Attending Physician (Urology)  Today's healthcare provider: Orma Render, NP   Chief Complaint  Patient presents with   Establish Care    Former PCP - Raiford Noble - Notes in [place]. Patient sees Dr. Jeffie Pollock with [facility] and [facility] [place]. No specidic concerns or complaints today   Hypertension    Patient bp is elevated in office. He states he does have white coat HTN   Subjective    Benjamin Fletcher is a 60 y.o. male who presents today as a new patient to establish care.  HPI  History of new diagnosis of diabetes last year in the summer. Has not had labs rechecked since initial diagnosis.  Metformin 500mg  BID.Tolerating well. No paresthesias, increased thirst, hunger, or urination.   Elevated lipids with last labs (summer 2021). Lipid lowering medication recommended by PCP, but patient chose not to start this. He tells me he does not want to take any medications that he does not have to. He has been working on diet and exercise to see if he can lower his levels this way. H does tel me his father had elevated lipids and was on lipid lowering medication for years then underwent a bypass. He reports that the physician caring for his father told him that we are "practicing medicine" and it is unclear if these medications make a difference. He tells me since that time he has determine that he doesn't want to take this unless it is absolutely necessary.   HTN - long term diagnosis. Taking HCTZ and Losartan. On supplement potassium and Magnesium due to increased sweating with outside work. Reports that his blood  pressures at home are in the 140's/80's and "this is as low as they go for me". He does endorse white coat htn with elevated readings when in the doctors office.  He has no ShOB, CP, palpitations, dizziness.   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   Sleep apnea    Past Surgical History:  Procedure Laterality Date   PROSTATE BIOPSY     skin cancer excised x3     TONSILLECTOMY     Family Status  Relation Name Status   Father  Deceased   Mother  Alive   Brother  Alive   Brother  Alive   Family History  Problem Relation Age of Onset   Heart disease Father        CABG   Cancer Father        prostate   Heart attack Father    Hyperlipidemia Father    Hypertension Mother    Cancer Brother        kidney and prostate   Hyperlipidemia Brother    Diabetes Brother    Hyperlipidemia Brother    Kidney disease Brother    Social History   Socioeconomic History   Marital status: Divorced    Spouse name: Not on file  Number of children: Not on file   Years of education: Not on file   Highest education level: Not on file  Occupational History   Occupation: HVAC  Tobacco Use   Smoking status: Former    Types: Cigarettes   Smokeless tobacco: Never  Vaping Use   Vaping Use: Never used  Substance and Sexual Activity   Alcohol use: Yes    Alcohol/week: 12.0 standard drinks    Types: 12 Cans of beer per week    Comment: weekends only   Drug use: No   Sexual activity: Yes  Other Topics Concern   Not on file  Social History Narrative   Not on file   Social Determinants of Health   Financial Resource Strain: Not on file  Food Insecurity: Not on file  Transportation Needs: Not on file  Physical Activity: Not on file  Stress: Not on file  Social Connections: Not on file   Outpatient Medications Prior to Visit  Medication  Sig   [DISCONTINUED] atorvastatin (LIPITOR) 20 MG tablet Take 1 tablet (20 mg total) by mouth daily.   [DISCONTINUED] hydrochlorothiazide (HYDRODIURIL) 25 MG tablet Take 1 tablet (25 mg total) by mouth daily.   [DISCONTINUED] losartan (COZAAR) 100 MG tablet Take 1 tablet (100 mg total) by mouth daily.   [DISCONTINUED] metFORMIN (GLUCOPHAGE) 500 MG tablet Take 1 tablet (500 mg total) by mouth 2 (two) times daily with a meal.   [DISCONTINUED] pantoprazole (PROTONIX) 40 MG tablet TAKE 1 TABLET BY MOUTH EVERY DAY   No facility-administered medications prior to visit.   No Known Allergies  Immunization History  Administered Date(s) Administered   Influenza,inj,Quad PF,6+ Mos 06/30/2017, 07/13/2018   PFIZER(Purple Top)SARS-COV-2 Vaccination 01/08/2020, 01/29/2020   Pneumococcal Polysaccharide-23 05/03/2020   Tdap 01/28/2010, 04/25/2020    Health Maintenance  Topic Date Due   OPHTHALMOLOGY EXAM  Never done   HIV Screening  Never done   Zoster Vaccines- Shingrix (1 of 2) Never done   COVID-19 Vaccine (3 - Pfizer risk series) 02/26/2020   HEMOGLOBIN A1C  10/26/2020   INFLUENZA VACCINE  04/30/2021   Pneumococcal Vaccine 48-76 Years old (2 - PCV) 05/03/2021   FOOT EXAM  05/03/2021   Fecal DNA (Cologuard)  04/26/2023   TETANUS/TDAP  04/25/2030   Hepatitis C Screening  Completed   HPV VACCINES  Aged Out    Patient Care Team: [place] Mielke, Coralee Pesa, NP as PCP - General (Nurse Practitioner) Irine Seal, MD as Attending Physician (Urology)  Review of Systems All review of systems negative except what is listed in the HPI    Objective    BP (!) 160/92   Pulse 75   Ht 6\' 3"  (1.905 m)   Wt 242 lb (109.8 kg)   SpO2 98%   BMI 30.25 kg/m  Physical Exam Vitals and nursing note reviewed.  Constitutional:      Appearance: Normal appearance. He is obese.  HENT:     Head: Normocephalic.  Eyes:     Extraocular Movements: Extraocular movements intact.     Conjunctiva/sclera: Conjunctivae  normal.     Pupils: Pupils are equal, round, and reactive to light.  Neck:     Vascular: No carotid bruit.  Cardiovascular:     Rate and Rhythm: Normal rate and regular rhythm.     Pulses: Normal pulses.     Heart sounds: Normal heart sounds. No murmur heard. Pulmonary:     Effort: Pulmonary effort is normal. No respiratory distress.     Breath  sounds: Normal breath sounds.  Abdominal:     General: Bowel sounds are normal.     Palpations: Abdomen is soft.     Tenderness: There is no abdominal tenderness. There is no right CVA tenderness, left CVA tenderness or rebound.  Musculoskeletal:        General: Normal range of motion.     Cervical back: Normal range of motion.     Right lower leg: No edema.     Left lower leg: No edema.  Skin:    General: Skin is warm and dry.     Capillary Refill: Capillary refill takes less than 2 seconds.  Neurological:     General: No focal deficit present.     Mental Status: He is alert and oriented to person, place, and time.  Psychiatric:        Mood and Affect: Mood normal.        Behavior: Behavior normal.        Thought Content: Thought content normal.        Judgment: Judgment normal.     Depression Screen PHQ 2/9 Scores 08/06/2021 04/11/2020 03/04/2016 12/21/2015  PHQ - 2 Score 0 0 0 0  PHQ- 9 Score 0 - - -   No results found for any visits on 08/06/21.  Assessment & Plan      Problem List Items Addressed This Visit     OSA (obstructive sleep apnea)    Chronic.  Patient uses CPAP and reports tolerating well with no day time fatigue.  Last sleep study several years ago- patient declined recommendation for re-evaluation of this today.  Will obtain labs today. In setting of HTN and BMI >27, patient may benefit from re-evaluation with pulm or neuro- will plan to discuss this in the future.        Malignant neoplasm of prostate ([place])    Followed by Dr. Jeffie Pollock with [facility].  Will obtain PSA today.       Relevant Orders    PSA Total (Reflex To Free)   Essential hypertension    Chronic.  No alarm symptoms present today BP is elevated in office today- patient reports this is typical for him d/t White Coat rxn.  Will monitor labs today as it has been over a year since this has been done. Will refill medications today      Relevant Medications   hydrochlorothiazide (HYDRODIURIL) 25 MG tablet   losartan (COZAAR) 100 MG tablet   Other Relevant Orders   CBC with Differential/Platelet   Comprehensive metabolic panel   Encounter for medical examination to establish care - Primary    Review of current and past medical history, social history, medication, and family history.  Review of care gaps and health maintenance recommendations.  Records from recent providers to be requested if not available in Chart Review or Care Everywhere.  Recommendations for health maintenance, diet, and exercise provided.  Labs today: CBC, CMP, LIPID, A1c, PSA HM Recommendations: Will review record to determine: flu shot given today CPE due: Will plan for next summer       Hyperlipidemia associated with type 2 diabetes mellitus ([place])    No recent lipids for evaluation Patient declines statin treatment at this time Will obtain labs today for evaluation- he is fasting Recommend continued diet and exercise therapy, diabetes, and BP control Discussed risks of elevated lipids in the setting of DM and CVD and recommendations for medication for prevention.  Will re-evaluate levels once labs received.  Relevant Medications   hydrochlorothiazide (HYDRODIURIL) 25 MG tablet   losartan (COZAAR) 100 MG tablet   metFORMIN (GLUCOPHAGE) 500 MG tablet   Other Relevant Orders   Lipid panel   Type 2 diabetes mellitus without complication, without long-term current use of insulin ([place])    No labs since summer 2021.  Will obtain labs today for further evaluation and recommendations Patient has been working on diet and exercise.  He  declines statin therapy.  BP not under optimal control at this time Will address and make changes to plan of care based on labs.       Relevant Medications   losartan (COZAAR) 100 MG tablet   metFORMIN (GLUCOPHAGE) 500 MG tablet   Other Relevant Orders   Hemoglobin A1c   POCT UA - Microalbumin   Other Visit Diagnoses     Encounter for immunization       Screening for prostate cancer       Relevant Orders   PSA Total (Reflex To Free)        Return in about 6 months (around 02/03/2022) for DM, HTN, HLD.      Gaile Allmon, Coralee Pesa, NP, DNP, AGNP-C Primary Care & Sports Medicine at [place]

## 2021-08-06 NOTE — Assessment & Plan Note (Signed)
Followed by Dr. Jeffie Pollock with [facility].  Will obtain PSA today.

## 2021-08-07 LAB — PSA TOTAL (REFLEX TO FREE): Prostate Specific Ag, Serum: 2.6 ng/mL (ref 0.0–4.0)

## 2021-12-26 DIAGNOSIS — L298 Other pruritus: Secondary | ICD-10-CM | POA: Diagnosis not present

## 2021-12-26 DIAGNOSIS — L538 Other specified erythematous conditions: Secondary | ICD-10-CM | POA: Diagnosis not present

## 2021-12-26 DIAGNOSIS — L821 Other seborrheic keratosis: Secondary | ICD-10-CM | POA: Diagnosis not present

## 2021-12-26 DIAGNOSIS — L570 Actinic keratosis: Secondary | ICD-10-CM | POA: Diagnosis not present

## 2021-12-26 DIAGNOSIS — Z08 Encounter for follow-up examination after completed treatment for malignant neoplasm: Secondary | ICD-10-CM | POA: Diagnosis not present

## 2021-12-26 DIAGNOSIS — D485 Neoplasm of uncertain behavior of skin: Secondary | ICD-10-CM | POA: Diagnosis not present

## 2021-12-26 DIAGNOSIS — D225 Melanocytic nevi of trunk: Secondary | ICD-10-CM | POA: Diagnosis not present

## 2021-12-26 DIAGNOSIS — L820 Inflamed seborrheic keratosis: Secondary | ICD-10-CM | POA: Diagnosis not present

## 2021-12-26 DIAGNOSIS — L814 Other melanin hyperpigmentation: Secondary | ICD-10-CM | POA: Diagnosis not present

## 2021-12-26 DIAGNOSIS — R208 Other disturbances of skin sensation: Secondary | ICD-10-CM | POA: Diagnosis not present

## 2022-02-04 ENCOUNTER — Ambulatory Visit (HOSPITAL_BASED_OUTPATIENT_CLINIC_OR_DEPARTMENT_OTHER): Payer: BC Managed Care – PPO | Admitting: Nurse Practitioner

## 2022-03-06 ENCOUNTER — Encounter (HOSPITAL_BASED_OUTPATIENT_CLINIC_OR_DEPARTMENT_OTHER): Payer: Self-pay | Admitting: Nurse Practitioner

## 2022-03-06 ENCOUNTER — Ambulatory Visit (INDEPENDENT_AMBULATORY_CARE_PROVIDER_SITE_OTHER): Payer: BC Managed Care – PPO | Admitting: Nurse Practitioner

## 2022-03-06 VITALS — BP 117/72 | HR 87 | Ht 75.0 in | Wt 258.0 lb

## 2022-03-06 DIAGNOSIS — E1165 Type 2 diabetes mellitus with hyperglycemia: Secondary | ICD-10-CM

## 2022-03-06 DIAGNOSIS — E1169 Type 2 diabetes mellitus with other specified complication: Secondary | ICD-10-CM

## 2022-03-06 DIAGNOSIS — E785 Hyperlipidemia, unspecified: Secondary | ICD-10-CM | POA: Diagnosis not present

## 2022-03-06 DIAGNOSIS — E870 Hyperosmolality and hypernatremia: Secondary | ICD-10-CM

## 2022-03-06 DIAGNOSIS — I10 Essential (primary) hypertension: Secondary | ICD-10-CM

## 2022-03-06 DIAGNOSIS — C61 Malignant neoplasm of prostate: Secondary | ICD-10-CM

## 2022-03-06 NOTE — Patient Instructions (Addendum)
It was a pleasure seeing you today. I hope your time spent with [place] was pleasant and helpful. Please let us know if there is anything we can do to improve the service you receive.   Today we discussed concerns with:  Essential hypertension  Hyperlipidemia associated with type 2 diabetes mellitus ([place])  Type 2 diabetes mellitus with hyperglycemia, without long-term current use of insulin ([place])  We will check labs today to monitor your kidney function, liver function, blood sugar, and cholesterol to make sure everything looks ok. We will also check on your PSA for monitoring.  For now continue your current medications. If we need to make any changes based on your labs, someone from the office will be in touch with you.  As long as everything is stable, we will plan to see you in 6 months, unless you need something sooner.     Important Office Information Lab Results If labs were ordered, please note that you will see results through [place] as soon as they come available from [place].  It takes up to 5 business days for the results to be routed to me and for me to review them once all of the lab results have come through from [facility]. I will make recommendations based on your results and send these through [place] or someone from the office will call you to discuss. If your labs are abnormal, we may contact you to schedule a visit to discuss the results and make recommendations.  If you have not heard from [place] within 5 business days or you have waited longer than a week and your lab results have not come through on [place], please feel free to call the office or send a message through [place] to follow-up on these labs.   Referrals If referrals were placed today, the office where the referral was sent will contact you either by phone or through [place] to set up scheduling. Please note that it can take up to a week for the referral office to contact you. If you do not hear from them in a week,  please contact the referral office directly to inquire about scheduling.   Condition Treated If your condition worsens or you begin to have new symptoms, please schedule a follow-up appointment for further evaluation. If you are not sure if an appointment is needed, you may call the office to leave a message for the nurse and someone will contact you with recommendations.  If you have an urgent or life threatening emergency, please do not call the office, but seek emergency evaluation by calling 911 or going to the nearest emergency room for evaluation.   MyChart and Phone Calls Please do not use MyChart for urgent messages. It may take up to 3 business days for MyChart messages to be read by staff and if they are unable to handle the request, an additional 3 business days for them to be routed to me and for my response.  Messages sent to the provider through [place] do not come directly to the provider, please allow time for these messages to be routed and for me to respond.  We get a large volume of MyChart messages daily and these are responded to in the order received.   For urgent messages, please call the office at [phone] and speak with the front office staff or leave a message on the line of my assistant for guidance.  We are seeing patients from the hours of 8:00 am through 5:00 pm  and calls directly to the nurse may not be answered immediately due to seeing patients, but your call will be returned as soon as possible.  Phone  messages received after 4:00 PM Monday through Thursday may not be returned until the following business day. Phone messages received after 11:00 AM on Friday may not be returned until Monday.   After Hours We share on call hours with providers from other offices. If you have an urgent need after hours that cannot wait until the next business day, please contact the on call provider by calling the office number. A nurse will speak with you and contact the provider  if needed for recommendations.  If you have an urgent or life threatening emergency after hours, please do not call the on call provider, but seek emergency evaluation by calling 911 or going to the nearest emergency room for evaluation.   Paperwork All paperwork requires a minimum of 5 days to complete and return to you or the designated personnel. Please keep this in mind when bringing in forms or sending requests for paperwork completion to the office.

## 2022-03-06 NOTE — Progress Notes (Addendum)
Benjamin Keeler, DNP, AGNP-c [employer] [place], [place] [address] [phone] Office [phone] Fax  ESTABLISHED PATIENT- Chronic Health and/or Follow-Up Visit  Blood pressure 117/72, pulse 87, height '6\' 3"'$  (1.905 m), weight 258 lb (117 kg), SpO2 96 %.  Follow-up (Patient presents today no issues just a follow up. )   HPI  Benjamin Fletcher  is a 61 y.o. year old male presenting today for evaluation and management of the following: DM Not checking BG at home- traditionally has been well controlled Monitoring diet and activity closely Recent Eye exam at [facility] No increased thirst, increased hunger, increased urination Taking medication as prescribed without missed doses No SE of medication HTN No CP, HA, Palp, Vision changes, ShOB Taking medication as prescribed No SE of medication No edema   ROS All ROS negative with exception of what is listed in HPI  PHYSICAL EXAM Physical Exam Vitals and nursing note reviewed.  Constitutional:      Appearance: Normal appearance.  HENT:     Head: Normocephalic.  Eyes:     Extraocular Movements: Extraocular movements intact.     Pupils: Pupils are equal, round, and reactive to light.  Neck:     Vascular: No carotid bruit.  Cardiovascular:     Rate and Rhythm: Normal rate and regular rhythm.     Pulses: Normal pulses.     Heart sounds: Normal heart sounds.  Pulmonary:     Effort: Pulmonary effort is normal.     Breath sounds: Normal breath sounds.  Abdominal:     General: Bowel sounds are normal.     Palpations: Abdomen is soft.  Musculoskeletal:        General: Normal range of motion.     Cervical back: Normal range of motion.     Right lower leg: No edema.     Left lower leg: No edema.  Skin:    General: Skin is warm and dry.     Capillary Refill: Capillary refill takes less than 2 seconds.  Neurological:     General: No focal deficit  present.     Mental Status: He is alert and oriented to person, place, and time.     Sensory: No sensory deficit.     Motor: No weakness.  Psychiatric:        Mood and Affect: Mood normal.        Behavior: Behavior normal.        Thought Content: Thought content normal.        Judgment: Judgment normal.     ASSESSMENT & PLAN Problem List Items Addressed This Visit     Malignant neoplasm of prostate ([place])    Chronic.  No current treatment required at this time. We will monitor labs today.  Followed by Dr[facility] Jeffie Pollock with [facility]. Current plan to increase vigilance once PSA values reach 3.5 or greater.      Relevant Orders   PSA Total (Reflex To Free) (Completed)   PSA Total (Reflex To Free)   Essential hypertension - Primary    Chronic.  Blood pressure within normal limits today.  No alarm symptoms present at this time. Patient tolerating medication well without side effects.  Recommend continuation of current medications and dose. We will obtain labs today for full evaluation. We will make changes to plan of care based on lab findings as necessary. Follow-up in 6 months or sooner if needed.  Relevant Orders   Hemoglobin A1c (Completed)   Comprehensive metabolic panel (Completed)   CBC (Completed)   POCT UA - Microalbumin   Hyperlipidemia associated with type 2 diabetes mellitus (HCC)    Chronic.  Not on any current medications.  Historically fairly well controlled with diet and exercise. We will obtain labs today for evaluation and monitoring.  Patient would benefit from low-dose statin therapy given his history of diabetes and hypertension.  We will plan to discuss further at follow-up visit.      Relevant Orders   Hemoglobin A1c (Completed)   Comprehensive metabolic panel (Completed)   CBC (Completed)   POCT UA - Microalbumin   Type 2 diabetes mellitus without complication, without long-term current use of insulin (H[facility])    Chronic.  Well-controlled on  metformin.  No alarm symptoms present today. Patient is monitoring diet and working on exercise appropriately. Continue with current medication and dosage. We will obtain labs today for further evaluation and make changes to plan of care as necessary based on findings.  Plan to follow-up in 6 months or sooner if needed.      Other Visit Diagnoses     Serum sodium elevated       Relevant Orders   Comprehensive metabolic panel        FOLLOW-UP Return in about 6 months (around 09/05/2022) for F/U HTN, HLD, DM.    Benjamin Keeler, DNP, AGNP-c

## 2022-03-07 LAB — CBC
Hematocrit: 42.6 % (ref 37.5–51.0)
Hemoglobin: 15.1 g/dL (ref 13.0–17.7)
MCH: 32.4 pg (ref 26.6–33.0)
MCHC: 35.4 g/dL (ref 31.5–35.7)
MCV: 91 fL (ref 79–97)
Platelets: 174 10*3/uL (ref 150–450)
RBC: 4.66 x10E6/uL (ref 4.14–5.80)
RDW: 13 % (ref 11.6–15.4)
WBC: 5.9 10*3/uL (ref 3.4–10.8)

## 2022-03-07 LAB — COMPREHENSIVE METABOLIC PANEL
ALT: 56 IU/L — ABNORMAL HIGH (ref 0–44)
AST: 17 IU/L (ref 0–40)
Albumin/Globulin Ratio: 2.7 — ABNORMAL HIGH (ref 1.2–2.2)
Albumin: 4.8 g/dL (ref 3.8–4.9)
Alkaline Phosphatase: 86 IU/L (ref 44–121)
BUN/Creatinine Ratio: 10 (ref 10–24)
BUN: 12 mg/dL (ref 8–27)
Bilirubin Total: 0.5 mg/dL (ref 0.0–1.2)
CO2: 25 mmol/L (ref 20–29)
Calcium: 10 mg/dL (ref 8.6–10.2)
Chloride: 98 mmol/L (ref 96–106)
Creatinine, Ser: 1.2 mg/dL (ref 0.76–1.27)
Globulin, Total: 1.8 g/dL (ref 1.5–4.5)
Glucose: 130 mg/dL — ABNORMAL HIGH (ref 70–99)
Potassium: 3.6 mmol/L (ref 3.5–5.2)
Sodium: 146 mmol/L — ABNORMAL HIGH (ref 134–144)
Total Protein: 6.6 g/dL (ref 6.0–8.5)
eGFR: 69 mL/min/{1.73_m2} (ref >59)

## 2022-03-07 LAB — HEMOGLOBIN A1C
Est. average glucose Bld gHb Est-mCnc: 114 mg/dL
Hgb A1c MFr Bld: 5.6 % (ref 4.8–5.6)

## 2022-03-07 LAB — PSA TOTAL (REFLEX TO FREE): Prostate Specific Ag, Serum: 3 ng/mL (ref 0.0–4.0)

## 2022-03-07 NOTE — Assessment & Plan Note (Signed)
Chronic.  Not on any current medications.  Historically fairly well controlled with diet and exercise. We will obtain labs today for evaluation and monitoring.  Patient would benefit from low-dose statin therapy given his history of diabetes and hypertension.  We will plan to discuss further at follow-up visit.

## 2022-03-07 NOTE — Assessment & Plan Note (Addendum)
Chronic.  No current treatment required at this time. We will monitor labs today.  Followed by Dr[facility] Jeffie Pollock with [facility]. Current plan to increase vigilance once PSA values reach 3.5 or greater.

## 2022-03-07 NOTE — Assessment & Plan Note (Signed)
Chronic.  Well-controlled on metformin.  No alarm symptoms present today. Patient is monitoring diet and working on exercise appropriately. Continue with current medication and dosage. We will obtain labs today for further evaluation and make changes to plan of care as necessary based on findings.  Plan to follow-up in 6 months or sooner if needed.

## 2022-03-07 NOTE — Assessment & Plan Note (Signed)
Chronic.  Blood pressure within normal limits today.  No alarm symptoms present at this time. Patient tolerating medication well without side effects.  Recommend continuation of current medications and dose. We will obtain labs today for full evaluation. We will make changes to plan of care based on lab findings as necessary. Follow-up in 6 months or sooner if needed.

## 2022-03-17 ENCOUNTER — Other Ambulatory Visit: Payer: Self-pay | Admitting: Family

## 2022-03-17 DIAGNOSIS — I10 Essential (primary) hypertension: Secondary | ICD-10-CM

## 2022-03-29 ENCOUNTER — Ambulatory Visit
Admission: RE | Admit: 2022-03-29 | Discharge: 2022-03-29 | Disposition: A | Discharge status: Home / Self Care | Payer: BC Managed Care – PPO | Source: Ambulatory Visit | Attending: Family Medicine | Admitting: Family Medicine

## 2022-03-29 VITALS — BP 173/112 | HR 66 | Temp 98.4°F | Resp 18

## 2022-03-29 DIAGNOSIS — H66002 Acute suppurative otitis media without spontaneous rupture of ear drum, left ear: Secondary | ICD-10-CM | POA: Diagnosis not present

## 2022-03-29 DIAGNOSIS — R0981 Nasal congestion: Secondary | ICD-10-CM | POA: Diagnosis not present

## 2022-03-29 MED ORDER — AMOXICILLIN-POT CLAVULANATE 875-125 MG PO TABS: 1.0000 | ORAL_TABLET | Freq: Two times a day (BID) | ORAL | 0 refills | Status: DC

## 2022-03-29 NOTE — ED Provider Notes (Signed)
[facility]    CSN: [id number] Arrival date & time: 03/29/22  1315      History   Chief Complaint Chief Complaint  Patient presents with   Ear Drainage    Lt Ear infection - Entered by patient    HPI Benjamin Fletcher is a 61 y.o. male.   HPI Patient presents today with a left ear otalgia x2 days.  He reports last night clearing a large amount of wax from his left ear.  He has had some associated nasal congestion and drainage over several weeks.  He has had no fever or any known sick contacts.  He reports a distant history of recurrent ear infections.  Denies any other associated symptoms. 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   Sleep apnea     Patient Active Problem List   Diagnosis Date Noted   Encounter for medical examination to establish care 08/06/2021   Hyperlipidemia associated with type 2 diabetes mellitus ([place]) 08/06/2021   Type 2 diabetes mellitus without complication, without long-term current use of insulin ([place]) 08/06/2021   Laryngopharyngeal reflux (LPR) 05/04/2020   Essential hypertension 04/11/2020   Malignant neoplasm of prostate ([place]) 03/04/2016   OSA (obstructive sleep apnea) 05/08/2011    Past Surgical History:  Procedure Laterality Date   PROSTATE BIOPSY     skin cancer excised x3     TONSILLECTOMY         Home Medications    Prior to Admission medications   Medication Sig Start Date End Date Taking? Authorizing Provider  amoxicillin-clavulanate (AUGMENTIN) 875-125 MG tablet Take 1 tablet by mouth 2 (two) times daily. 03/29/22  Yes Scot Jun, FNP  hydrochlorothiazide (HYDRODIURIL) 25 MG tablet Take 1 tablet (25 mg total) by mouth daily. 08/06/21   Orma Render, NP  losartan (COZAAR) 100 MG tablet Take 1 tablet (100 mg total) by mouth daily. 08/06/21   Orma Render, NP   metFORMIN (GLUCOPHAGE) 500 MG tablet Take 1 tablet (500 mg total) by mouth 2 (two) times daily with a meal. 08/06/21   Early, Coralee Pesa, NP    Family History Family History  Problem Relation Age of Onset   Heart disease Father        CABG   Cancer Father        prostate   Heart attack Father    Hyperlipidemia Father    Hypertension Mother    Cancer Brother        kidney and prostate   Hyperlipidemia Brother    Diabetes Brother    Hyperlipidemia Brother    Kidney disease Brother     Social History Social History   Tobacco Use   Smoking status: Former    Types: Cigarettes   Smokeless tobacco: Never  Vaping Use   Vaping Use: Never used  Substance Use Topics   Alcohol use: Yes    Alcohol/week: 12.0 standard drinks of alcohol    Types: 12 Cans of beer per week    Comment: weekends only   Drug use: No     Allergies   Patient has no known allergies.   Review of Systems Review of Systems Pertinent negatives listed in HPI   Physical Exam Triage Vital Signs ED Triage Vitals [03/29/22 1349]  Enc Vitals Group  BP (!) 173/111/Recheck 164/98     Pulse Rate 66     Resp 18     Temp 98.4 F (36.9 C)     Temp Source Oral     SpO2 95 %     Weight      Height      Head Circumference      Peak Flow      Pain Score 3     Pain Loc      Pain Edu?      Excl. in [place]?    No data found.  Updated Vital Signs BP (!) 173/112 (BP Location: Left Arm) Comment: Provider made aware, patient states he did take his BP medications today  Pulse 66   Temp 98.4 F (36.9 C) (Oral)   Resp 18   SpO2 95%   Visual Acuity Right Eye Distance:   Left Eye Distance:   Bilateral Distance:    Right Eye Near:   Left Eye Near:    Bilateral Near:     Physical Exam Constitutional:      Appearance: Normal appearance.  HENT:     Right Ear: Hearing, tympanic membrane, ear canal and external ear normal.     Left Ear: Swelling and tenderness present. Tympanic membrane is erythematous.      Ears:     Comments: Preauricular and postauricular adenopathy Eyes:     Extraocular Movements: Extraocular movements intact.     Pupils: Pupils are equal, round, and reactive to light.  Cardiovascular:     Rate and Rhythm: Normal rate and regular rhythm.  Pulmonary:     Effort: Pulmonary effort is normal.     Breath sounds: Normal breath sounds.  Skin:    General: Skin is warm and dry.     Capillary Refill: Capillary refill takes less than 2 seconds.  Neurological:     General: No focal deficit present.     Mental Status: He is alert and oriented to person, place, and time.      UC Treatments / Results  Labs (all labs ordered are listed, but only abnormal results are displayed) Labs Reviewed - No data to display  EKG   Radiology No results found.  Procedures Procedures (including critical care time)  Medications Ordered in UC Medications - No data to display  Initial Impression / Assessment and Plan / UC Course  I have reviewed the triage vital signs and the nursing notes.  Pertinent labs & imaging results that were available during my care of the patient were reviewed by me and considered in my medical decision making (see chart for details).    None recurrent otitis media involving the left ear With sinus congestion Treatment with Augmentin twice daily for 10 days. Hydrate well with fluids. Of note patient's blood pressure is elevated today.  Recent BP during physical was at goal of less than 120/80.  Patient only recently took his blood pressure medicine.  Monitor BP follow-up with PCP as needed. Final Clinical Impressions(s) / UC Diagnoses   Final diagnoses:  Non-recurrent acute suppurative otitis media of left ear without spontaneous rupture of tympanic membrane  Sinus congestion   Discharge Instructions   None    ED Prescriptions     Medication Sig Dispense Auth. Provider   amoxicillin-clavulanate (AUGMENTIN) 875-125 MG tablet Take 1 tablet by mouth  2 (two) times daily. 20 tablet Scot Jun, FNP      PDMP not reviewed this encounter.   Molli Barrows  S, FNP 03/29/22 1518

## 2022-03-29 NOTE — ED Triage Notes (Signed)
Pt c/o left ear pain, he states the pain is radiating down to the left side of his throat.  Started: yesterday  Home interventions: none

## 2022-06-21 ENCOUNTER — Other Ambulatory Visit (HOSPITAL_BASED_OUTPATIENT_CLINIC_OR_DEPARTMENT_OTHER): Payer: Self-pay | Admitting: Nurse Practitioner

## 2022-06-21 DIAGNOSIS — C61 Malignant neoplasm of prostate: Secondary | ICD-10-CM | POA: Diagnosis not present

## 2022-06-21 DIAGNOSIS — E870 Hyperosmolality and hypernatremia: Secondary | ICD-10-CM | POA: Diagnosis not present

## 2022-06-22 LAB — COMPREHENSIVE METABOLIC PANEL
ALT: 60 IU/L — ABNORMAL HIGH (ref 0–44)
AST: 18 IU/L (ref 0–40)
Albumin/Globulin Ratio: 2.3 — ABNORMAL HIGH (ref 1.2–2.2)
Albumin: 4.6 g/dL (ref 3.8–4.9)
Alkaline Phosphatase: 80 IU/L (ref 44–121)
BUN/Creatinine Ratio: 15 (ref 10–24)
BUN: 16 mg/dL (ref 8–27)
Bilirubin Total: 0.5 mg/dL (ref 0.0–1.2)
CO2: 24 mmol/L (ref 20–29)
Calcium: 9.3 mg/dL (ref 8.6–10.2)
Chloride: 102 mmol/L (ref 96–106)
Creatinine, Ser: 1.09 mg/dL (ref 0.76–1.27)
Globulin, Total: 2 g/dL (ref 1.5–4.5)
Glucose: 178 mg/dL — ABNORMAL HIGH (ref 70–99)
Potassium: 3.8 mmol/L (ref 3.5–5.2)
Sodium: 141 mmol/L (ref 134–144)
Total Protein: 6.6 g/dL (ref 6.0–8.5)
eGFR: 78 mL/min/{1.73_m2} (ref >59)

## 2022-06-22 LAB — PSA TOTAL (REFLEX TO FREE): Prostate Specific Ag, Serum: 3.6 ng/mL (ref 0.0–4.0)

## 2022-06-24 DIAGNOSIS — C61 Malignant neoplasm of prostate: Secondary | ICD-10-CM | POA: Diagnosis not present

## 2022-06-24 DIAGNOSIS — R972 Elevated prostate specific antigen [PSA]: Secondary | ICD-10-CM | POA: Diagnosis not present

## 2022-08-29 ENCOUNTER — Other Ambulatory Visit (HOSPITAL_BASED_OUTPATIENT_CLINIC_OR_DEPARTMENT_OTHER): Payer: Self-pay | Admitting: Nurse Practitioner

## 2022-08-29 DIAGNOSIS — I10 Essential (primary) hypertension: Secondary | ICD-10-CM

## 2022-08-30 ENCOUNTER — Other Ambulatory Visit (HOSPITAL_BASED_OUTPATIENT_CLINIC_OR_DEPARTMENT_OTHER): Payer: Self-pay | Admitting: Nurse Practitioner

## 2022-08-30 DIAGNOSIS — E119 Type 2 diabetes mellitus without complications: Secondary | ICD-10-CM

## 2022-09-05 ENCOUNTER — Ambulatory Visit (INDEPENDENT_AMBULATORY_CARE_PROVIDER_SITE_OTHER): Payer: BC Managed Care – PPO | Admitting: Family Medicine

## 2022-09-05 ENCOUNTER — Ambulatory Visit (INDEPENDENT_AMBULATORY_CARE_PROVIDER_SITE_OTHER): Payer: BC Managed Care – PPO

## 2022-09-05 ENCOUNTER — Encounter (HOSPITAL_BASED_OUTPATIENT_CLINIC_OR_DEPARTMENT_OTHER): Payer: Self-pay | Admitting: Family Medicine

## 2022-09-05 VITALS — BP 174/89 | HR 83 | Temp 97.6°F | Ht 75.0 in | Wt 260.5 lb

## 2022-09-05 DIAGNOSIS — I10 Essential (primary) hypertension: Secondary | ICD-10-CM

## 2022-09-05 DIAGNOSIS — G8929 Other chronic pain: Secondary | ICD-10-CM

## 2022-09-05 DIAGNOSIS — E119 Type 2 diabetes mellitus without complications: Secondary | ICD-10-CM

## 2022-09-05 DIAGNOSIS — M25551 Pain in right hip: Secondary | ICD-10-CM

## 2022-09-05 DIAGNOSIS — C61 Malignant neoplasm of prostate: Secondary | ICD-10-CM

## 2022-09-05 DIAGNOSIS — Z23 Encounter for immunization: Secondary | ICD-10-CM | POA: Diagnosis not present

## 2022-09-05 MED ORDER — LOSARTAN POTASSIUM 100 MG PO TABS: 100.0000 mg | ORAL_TABLET | Freq: Every day | ORAL | 1 refills | Status: DC

## 2022-09-05 NOTE — Assessment & Plan Note (Signed)
He has been having gradually increasing PSA and did have appointment with urology last visit with Sarabeth.  Reports that they will be planning for prostate biopsy, this has yet to be arranged He would like [place] to recheck PSA today

## 2022-09-05 NOTE — Assessment & Plan Note (Signed)
Patient reports that he has been having ongoing right hip pain for about 5 months now.  Initially started when moving things around in his attic in July.  To have some pain over his hip, primarily indicates through glutes, low back.  Pain may end up being in different areas around the hip.  Denies any prior issues.  No radiation of symptoms into the lower leg. On exam, patient does not have any significant tenderness to palpation about the hip.  Normal active and passive range of motion for flexion, abduction and adduction.  No significant pain with resisted hip abduction or adduction.  Negative FABER and FADIR.  Negative logroll. Suspect that current symptoms are mostly related to hip abductors, feel that underlying joint pathology/arthritis is less likely. We will proceed initially with hip x-rays today given duration of symptoms.  Feel the patient would benefit from physical therapy, referral placed today Will plan for follow-up in about 2 to 3 months to monitor progress and determine need for further evaluation or treatment considerations

## 2022-09-05 NOTE — Assessment & Plan Note (Signed)
Blood pressure elevated in office today.  Has previously had variable control in the office.  He does report that he checks his blood pressure intermittently at home and has better readings at home.  Home readings do tend to be borderline.  Denies any issues with chest pain or headaches, no lightheadedness or dizziness.  He continues with hydrochlorothiazide and losartan.  Does need refill of losartan today. Recheck of blood pressure still above goal, we will need to continue monitoring closely.  Would be reasonable for patient to bring home blood with our readings here in the office Recommend intermittent monitoring of blood pressure at home, DASH diet

## 2022-09-05 NOTE — Patient Instructions (Signed)
  Medication Instructions:  Your physician recommends that you continue on your current medications as directed. Please refer to the Current Medication list given to you today. --If you need a refill on any your medications before your next appointment, please call your pharmacy first. If no refills are authorized on file call the office.-- Lab Work: Your physician has recommended that you have lab work today: Yes If you have labs (blood work) drawn today and your tests are completely normal, you will receive your results via [place] a phone call from our staff.  Please ensure you check your voicemail in the event that you authorized detailed messages to be left on a delegated number. If you have any lab test that is abnormal or we need to change your treatment, we will call you to review the results.  Referrals/Procedures/Imaging: Yes  Follow-Up: Your next appointment:   Your physician recommends that you schedule a follow-up appointment in: 2-3 months with Dr. de [place].  You will receive a text message or e-mail with a link to a survey about your care and experience with [place] today! We would greatly appreciate your feedback!   Thanks for letting us be apart of your health journey!!  Primary Care and Sports Medicine   Dr. Arlina Robes [place]   We encourage you to activate your patient portal called "MyChart".  Sign up information is provided on this After Visit Summary.  MyChart is used to connect with patients for Virtual Visits (Telemedicine).  Patients are able to view lab/test results, encounter notes, upcoming appointments, etc.  Non-urgent messages can be sent to your provider as well. To learn more about what you can do with MyChart, please visit --  [email or web address].

## 2022-09-05 NOTE — Progress Notes (Signed)
    Procedures performed today:    None.  Independent interpretation of notes and tests performed by another provider:   None.  Brief History, Exam, Impression, and Recommendations:    BP (!) 174/89 (BP Location: Right Arm, Patient Position: Sitting, Cuff Size: Large)   Pulse 83   Temp 97.6 F (36.4 C) (Oral)   Ht '6\' 3"'$  (1.905 m)   Wt 260 lb 8 oz (118.2 kg)   SpO2 100%   BMI 32.56 kg/m   Essential hypertension Blood pressure elevated in office today.  Has previously had variable control in the office.  He does report that he checks his blood pressure intermittently at home and has better readings at home.  Home readings do tend to be borderline.  Denies any issues with chest pain or headaches, no lightheadedness or dizziness.  He continues with hydrochlorothiazide and losartan.  Does need refill of losartan today. Recheck of blood pressure still above goal, we will need to continue monitoring closely.  Would be reasonable for patient to bring home blood with our readings here in the office Recommend intermittent monitoring of blood pressure at home, DASH diet  Type 2 diabetes mellitus without complication, without long-term current use of insulin ([place]) Manages currently with use of metformin, 500 mg twice daily.  Recent hemoglobin A1c has shown good control, due for recheck today.  Denies any new symptoms or issues such as polyuria or polydipsia. For now, can continue with metformin Will check hemoglobin A1c as well as urine microalbumin/creatinine ratio today for screening Plan to complete foot exam at future visit Patient likely would benefit from statin therapy, we will need to update labs and further discuss at future visits  Malignant neoplasm of prostate [facility]) He has been having gradually increasing PSA and did have appointment with urology last visit with Sarabeth.  Reports that they will be planning for prostate biopsy, this has yet to be arranged He would like [place] to  recheck PSA today  Chronic right hip pain Patient reports that he has been having ongoing right hip pain for about 5 months now.  Initially started when moving things around in his attic in July.  To have some pain over his hip, primarily indicates through glutes, low back.  Pain may end up being in different areas around the hip.  Denies any prior issues.  No radiation of symptoms into the lower leg. On exam, patient does not have any significant tenderness to palpation about the hip.  Normal active and passive range of motion for flexion, abduction and adduction.  No significant pain with resisted hip abduction or adduction.  Negative FABER and FADIR.  Negative logroll. Suspect that current symptoms are mostly related to hip abductors, feel that underlying joint pathology/arthritis is less likely. We will proceed initially with hip x-rays today given duration of symptoms.  Feel the patient would benefit from physical therapy, referral placed today Will plan for follow-up in about 2 to 3 months to monitor progress and determine need for further evaluation or treatment considerations  Spent 43 minutes on this patient encounter, including preparation, chart review, face-to-face counseling with patient and coordination of care, and documentation of encounter  Return in about 3 months (around 12/05/2022) for right hip, HTN, DM.   ___________________________________________ Aarilyn Dye de [place], MD, ABFM, CAQSM Primary Care and [place]

## 2022-09-05 NOTE — Assessment & Plan Note (Addendum)
Manages currently with use of metformin, 500 mg twice daily.  Recent hemoglobin A1c has shown good control, due for recheck today.  Denies any new symptoms or issues such as polyuria or polydipsia. For now, can continue with metformin Will check hemoglobin A1c as well as urine microalbumin/creatinine ratio today for screening Plan to complete foot exam at future visit Patient likely would benefit from statin therapy, we will need to update labs and further discuss at future visits

## 2022-09-06 LAB — PSA TOTAL (REFLEX TO FREE): Prostate Specific Ag, Serum: 2.4 ng/mL (ref 0.0–4.0)

## 2022-09-06 LAB — HEMOGLOBIN A1C
Est. average glucose Bld gHb Est-mCnc: 123 mg/dL
Hgb A1c MFr Bld: 5.9 % — ABNORMAL HIGH (ref 4.8–5.6)

## 2022-09-07 LAB — MICROALBUMIN / CREATININE URINE RATIO
Creatinine, Urine: 47.5 mg/dL
Microalb/Creat Ratio: <6 mg/g creat (ref 0–29)
Microalbumin, Urine: <3 ug/mL

## 2022-10-02 ENCOUNTER — Ambulatory Visit (INDEPENDENT_AMBULATORY_CARE_PROVIDER_SITE_OTHER): Payer: BC Managed Care – PPO | Admitting: Orthopaedic Surgery

## 2022-10-02 DIAGNOSIS — M533 Sacrococcygeal disorders, not elsewhere classified: Secondary | ICD-10-CM | POA: Diagnosis not present

## 2022-10-02 NOTE — Progress Notes (Signed)
Chief Complaint: Right hip pain     History of Present Illness:    Benjamin Fletcher is a 62 y.o. male presents today with right hip pain which has been predominantly about the posterior SI joint.  Of note he does have a history of prostate carcinoma for which she has been treated and had biopsies in the past.  He is concerned for any type of bony metastasis.  He states that the pain predominantly occurs when he is sitting down for longer periods of time.  He does not have pain while standing.  He has not had any specific injury.  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   Sleep apnea    Past Surgical History:  Procedure Laterality Date   PROSTATE BIOPSY     skin cancer excised x3     TONSILLECTOMY     Social History   Socioeconomic History   Marital status: Divorced    Spouse name: Not on file   Number of children: Not on file   Years of education: Not on file   Highest education level: Not on file  Occupational History   Occupation: HVAC  Tobacco Use   Smoking status: Former    Types: Cigarettes   Smokeless tobacco: Never  Vaping Use   Vaping Use: Never used  Substance and Sexual Activity   Alcohol use: Yes    Alcohol/week: 12.0 standard drinks of alcohol    Types: 12 Cans of beer per week    Comment: weekends only   Drug use: No   Sexual activity: Yes  Other Topics Concern   Not on file  Social History Narrative   Not on file   Social Determinants of Health   Financial Resource Strain: Not on file  Food Insecurity: Not on file  Transportation Needs: Not on file  Physical Activity: Not on file  Stress: Not on file  Social  Connections: Not on file   Family History  Problem Relation Age of Onset   Heart disease Father        CABG   Cancer Father        prostate   Heart attack Father    Hyperlipidemia Father    Hypertension Mother    Cancer Brother        kidney and prostate   Hyperlipidemia Brother    Diabetes Brother    Hyperlipidemia Brother    Kidney disease Brother    No Known Allergies Current Outpatient Medications  Medication Sig Dispense Refill   hydrochlorothiazide (HYDRODIURIL) 25 MG tablet TAKE 1 TABLET (25 MG TOTAL) BY MOUTH DAILY. 90 tablet 3   losartan (COZAAR) 100 MG tablet Take 1 tablet (100 mg total) by mouth daily. 90 tablet 1   metFORMIN (GLUCOPHAGE) 500 MG tablet TAKE 1 TABLET BY MOUTH 2 TIMES DAILY WITH A MEAL. 180 tablet 3   No current facility-administered medications for this visit.   No results found.  Review  of Systems:   A ROS was performed including pertinent positives and negatives as documented in the HPI.  Physical Exam :   Constitutional: NAD and appears stated age Neurological: Alert and oriented Psych: Appropriate affect and cooperative There were no vitals taken for this visit.   Comprehensive Musculoskeletal Exam:    Tenderness about the posterior SI joint.  There is tenderness about the SI with compression about the lateral iliac crest.  Range of motion of the right hip is from 20 degrees internal rotation 30 degrees external rotation smoothly gliding without pain.  Imaging:   Xray (AP pelvis, 3 views right hip): There is subchondral sclerosis involving the femoral heads of both hips although this appears to be more consistent with early osteoarthritis, there is some lucency   I personally reviewed and interpreted the radiographs.   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  Given the fact that he does have a history of prostate cancer I have advised that an MRI of his pelvis is needed at this time in order to rule out underlying bony  metastases.  With regard to his right hip pain I do believe this is most likely coming from his SI joint.  We did briefly did talk about the role for an SI injection and he will consider this more.  Plan :    -Return to clinic following MRI for discussion     I personally saw and evaluated the patient, and participated in the management and treatment plan.  Vanetta Mulders, MD Attending Physician, Orthopedic Surgery  This document was dictated using Dragon voice recognition software. A reasonable attempt at proof reading has been made to minimize errors.

## 2022-10-09 ENCOUNTER — Telehealth: Payer: Self-pay | Admitting: Orthopaedic Surgery

## 2022-10-09 NOTE — Telephone Encounter (Signed)
Patient wants to know if his MRI has been approved. [phone]

## 2022-10-10 ENCOUNTER — Ambulatory Visit (HOSPITAL_BASED_OUTPATIENT_CLINIC_OR_DEPARTMENT_OTHER): Admission: RE | Admit: 2022-10-10 | Payer: BC Managed Care – PPO | Source: Ambulatory Visit

## 2022-10-10 NOTE — Telephone Encounter (Signed)
LMOM stating MRI has now been authorized at [facility] and left their # for scheduling. Also apologized for the mishap on behalf of our office.

## 2022-10-14 ENCOUNTER — Ambulatory Visit (INDEPENDENT_AMBULATORY_CARE_PROVIDER_SITE_OTHER): Payer: BC Managed Care – PPO

## 2022-10-14 ENCOUNTER — Other Ambulatory Visit: Payer: BC Managed Care – PPO

## 2022-10-14 DIAGNOSIS — M533 Sacrococcygeal disorders, not elsewhere classified: Secondary | ICD-10-CM

## 2022-10-14 DIAGNOSIS — N400 Enlarged prostate without lower urinary tract symptoms: Secondary | ICD-10-CM | POA: Diagnosis not present

## 2022-10-14 DIAGNOSIS — G8929 Other chronic pain: Secondary | ICD-10-CM

## 2022-10-14 DIAGNOSIS — Z8546 Personal history of malignant neoplasm of prostate: Secondary | ICD-10-CM | POA: Diagnosis not present

## 2022-10-14 DIAGNOSIS — M160 Bilateral primary osteoarthritis of hip: Secondary | ICD-10-CM | POA: Diagnosis not present

## 2022-10-14 DIAGNOSIS — M25551 Pain in right hip: Secondary | ICD-10-CM | POA: Diagnosis not present

## 2022-10-14 MED ORDER — GADOBUTROL 1 MMOL/ML IV SOLN
10.0000 mL | Freq: Once | INTRAVENOUS | Status: AC | PRN
  Administered 2022-10-14: 10 mL via INTRAVENOUS

## 2022-10-23 ENCOUNTER — Ambulatory Visit (HOSPITAL_BASED_OUTPATIENT_CLINIC_OR_DEPARTMENT_OTHER): Payer: BC Managed Care – PPO | Admitting: Orthopaedic Surgery

## 2022-10-23 ENCOUNTER — Ambulatory Visit (INDEPENDENT_AMBULATORY_CARE_PROVIDER_SITE_OTHER): Payer: BC Managed Care – PPO | Admitting: Orthopaedic Surgery

## 2022-10-23 DIAGNOSIS — M87052 Idiopathic aseptic necrosis of left femur: Secondary | ICD-10-CM

## 2022-10-23 DIAGNOSIS — M87051 Idiopathic aseptic necrosis of right femur: Secondary | ICD-10-CM

## 2022-10-23 NOTE — Progress Notes (Signed)
Chief Complaint: Right hip pain     History of Present Illness:   10/23/2022: Presents today for follow-up of his right hip.  Overall he is still experiencing some soreness about the SI joint.  Here today for MRI review  Benjamin Fletcher is a 62 y.o. male presents today with right hip pain which has been predominantly about the posterior SI joint.  Of note he does have a history of prostate carcinoma for which she has been treated and had biopsies in the past.  He is concerned for any type of bony metastasis.  He states that the pain predominantly occurs when he is sitting down for longer periods of time.  He does not have pain while standing.  He has not had any specific injury.  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   Sleep apnea    Past Surgical History:  Procedure Laterality Date   PROSTATE BIOPSY     skin cancer excised x3     TONSILLECTOMY     Social History   Socioeconomic History   Marital status: Divorced    Spouse name: Not on file   Number of children: Not on file   Years of education: Not on file   Highest education level: Not on file  Occupational History   Occupation: HVAC  Tobacco Use   Smoking status: Former    Types: Cigarettes   Smokeless tobacco: Never  Vaping Use   Vaping Use: Never used  Substance and Sexual Activity   Alcohol use: Yes    Alcohol/week: 12.0 standard drinks of alcohol    Types: 12 Cans of beer per week    Comment: weekends only   Drug use: No   Sexual activity: Yes  Other Topics Concern   Not on file  Social History Narrative   Not on file   Social Determinants of Health   Financial  Resource Strain: Not on file  Food Insecurity: Not on file  Transportation Needs: Not on file  Physical Activity: Not on file  Stress: Not on file  Social Connections: Not on file   Family History  Problem Relation Age of Onset   Heart disease Father        CABG   Cancer Father        prostate   Heart attack Father    Hyperlipidemia Father    Hypertension Mother    Cancer Brother        kidney and prostate   Hyperlipidemia Brother    Diabetes Brother    Hyperlipidemia Brother    Kidney disease Brother    No Known Allergies Current Outpatient Medications  Medication Sig Dispense Refill   hydrochlorothiazide (HYDRODIURIL) 25 MG tablet TAKE 1 TABLET (25 MG TOTAL) BY MOUTH DAILY. 90 tablet 3   losartan (COZAAR) 100 MG tablet Take 1 tablet (100 mg total) by mouth daily. 90 tablet 1   metFORMIN (GLUCOPHAGE) 500 MG tablet TAKE 1 TABLET  BY MOUTH 2 TIMES DAILY WITH A MEAL. 180 tablet 3   No current facility-administered medications for this visit.   No results found.  Review of Systems:   A ROS was performed including pertinent positives and negatives as documented in the HPI.  Physical Exam :   Constitutional: NAD and appears stated age Neurological: Alert and oriented Psych: Appropriate affect and cooperative There were no vitals taken for this visit.   Comprehensive Musculoskeletal Exam:    Tenderness about the posterior SI joint.  There is tenderness about the SI with compression about the lateral iliac crest.  Range of motion of the right hip is from 20 degrees internal rotation 30 degrees external rotation smoothly gliding without pain.  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  Overall I do believe this is relatively asymptomatic for him and his right SI joint is what is causing him the most symptoms.  I did continue to offer an SI injection as needed.  He would like to defer as he is overall doing quite well today.  He will send me a MyChart message should he want to consider this  Plan :    -Return to clinic as needed    I personally saw and evaluated the patient, and participated in the management and treatment plan.  Vanetta Mulders, MD Attending Physician, Orthopedic Surgery  This document was dictated using Dragon voice recognition software. A reasonable attempt at proof reading has been made to minimize errors.

## 2022-11-01 ENCOUNTER — Ambulatory Visit (HOSPITAL_BASED_OUTPATIENT_CLINIC_OR_DEPARTMENT_OTHER): Payer: BC Managed Care – PPO | Admitting: Orthopaedic Surgery

## 2022-12-23 DIAGNOSIS — N400 Enlarged prostate without lower urinary tract symptoms: Secondary | ICD-10-CM | POA: Diagnosis not present

## 2022-12-30 DIAGNOSIS — D225 Melanocytic nevi of trunk: Secondary | ICD-10-CM | POA: Diagnosis not present

## 2022-12-30 DIAGNOSIS — L814 Other melanin hyperpigmentation: Secondary | ICD-10-CM | POA: Diagnosis not present

## 2022-12-30 DIAGNOSIS — C61 Malignant neoplasm of prostate: Secondary | ICD-10-CM | POA: Diagnosis not present

## 2022-12-30 DIAGNOSIS — N400 Enlarged prostate without lower urinary tract symptoms: Secondary | ICD-10-CM | POA: Diagnosis not present

## 2022-12-30 DIAGNOSIS — Z789 Other specified health status: Secondary | ICD-10-CM | POA: Diagnosis not present

## 2022-12-30 DIAGNOSIS — Z08 Encounter for follow-up examination after completed treatment for malignant neoplasm: Secondary | ICD-10-CM | POA: Diagnosis not present

## 2022-12-30 DIAGNOSIS — L821 Other seborrheic keratosis: Secondary | ICD-10-CM | POA: Diagnosis not present

## 2022-12-30 DIAGNOSIS — R208 Other disturbances of skin sensation: Secondary | ICD-10-CM | POA: Diagnosis not present

## 2022-12-30 DIAGNOSIS — L820 Inflamed seborrheic keratosis: Secondary | ICD-10-CM | POA: Diagnosis not present

## 2022-12-30 DIAGNOSIS — R972 Elevated prostate specific antigen [PSA]: Secondary | ICD-10-CM | POA: Diagnosis not present

## 2023-02-28 ENCOUNTER — Other Ambulatory Visit (HOSPITAL_BASED_OUTPATIENT_CLINIC_OR_DEPARTMENT_OTHER): Payer: Self-pay | Admitting: Family Medicine

## 2023-02-28 DIAGNOSIS — I10 Essential (primary) hypertension: Secondary | ICD-10-CM

## 2023-03-12 ENCOUNTER — Encounter (HOSPITAL_BASED_OUTPATIENT_CLINIC_OR_DEPARTMENT_OTHER): Payer: Self-pay | Admitting: Family Medicine

## 2023-04-02 ENCOUNTER — Other Ambulatory Visit (HOSPITAL_BASED_OUTPATIENT_CLINIC_OR_DEPARTMENT_OTHER): Payer: Self-pay

## 2023-04-02 ENCOUNTER — Encounter (HOSPITAL_BASED_OUTPATIENT_CLINIC_OR_DEPARTMENT_OTHER): Payer: Self-pay | Admitting: Family Medicine

## 2023-04-02 ENCOUNTER — Ambulatory Visit (INDEPENDENT_AMBULATORY_CARE_PROVIDER_SITE_OTHER): Payer: BC Managed Care – PPO | Admitting: Family Medicine

## 2023-04-02 VITALS — BP 168/96 | HR 74 | Ht 75.0 in | Wt 263.8 lb

## 2023-04-02 DIAGNOSIS — H6690 Otitis media, unspecified, unspecified ear: Secondary | ICD-10-CM | POA: Diagnosis not present

## 2023-04-02 MED ORDER — AMOXICILLIN-POT CLAVULANATE 875-125 MG PO TABS: 1.0000 | ORAL_TABLET | Freq: Two times a day (BID) | ORAL | 0 refills | Status: DC

## 2023-04-02 MED ORDER — AMOXICILLIN-POT CLAVULANATE 875-125 MG PO TABS
1.0000 | ORAL_TABLET | Freq: Two times a day (BID) | ORAL | 0 refills | Status: DC
  Filled 2023-04-02: qty 14, 7d supply, fill #0

## 2023-04-02 NOTE — Patient Instructions (Signed)
  Medication Instructions:  Your physician recommends that you continue on your current medications as directed. Please refer to the Current Medication list given to you today. --If you need a refill on any your medications before your next appointment, please call your pharmacy first. If no refills are authorized on file call the office.-- Lab Work: Your physician has recommended that you have lab work today: No If you have labs (blood work) drawn today and your tests are completely normal, you will receive your results via MyChart message OR a phone call from our staff.  Please ensure you check your voicemail in the event that you authorized detailed messages to be left on a delegated number. If you have any lab test that is abnormal or we need to change your treatment, we will call you to review the results.  Referrals/Procedures/Imaging: No  Follow-Up: Your next appointment:   Your physician recommends that you schedule a follow-up appointment in: 3-4 weeks  with Dr. de [place].  You will receive a text message or e-mail with a link to a survey about your care and experience with us today! We would greatly appreciate your feedback!   Thanks for letting us be apart of your health journey!!  Primary Care and Sports Medicine   Dr. Raymond de Cuba   We encourage you to activate your patient portal called "MyChart".  Sign up information is provided on this After Visit Summary.  MyChart is used to connect with patients for Virtual Visits (Telemedicine).  Patients are able to view lab/test results, encounter notes, upcoming appointments, etc.  Non-urgent messages can be sent to your provider as well. To learn more about what you can do with MyChart, please visit --  [email or web address].    

## 2023-04-02 NOTE — Assessment & Plan Note (Signed)
Patient reports that he woke up during the night with noted left ear pain.  He has associated fullness/pressure in left ear.  No symptoms with right ear.  He reports that he did get sick from family member about 2 weeks ago and is still having some ongoing sinus congestion currently.  Has not had any fever, chills, sweats.  Has not utilized any OTC medications. On exam, patient is in no acute distress, vital signs stable, patient is afebrile.  Right ear with clear external auditory canal, normal-appearing tympanic membrane.  Left ear with small amount of cerumen noted within the EAC, tympanic membrane with mild bulging, peripheral erythema around edges of TM. Given current symptoms, findings on physical exam, would be reasonable to proceed with antibiotic therapy at this time.  Will proceed with Augmentin as below.  Instructed on proper administration of medication, ensuring completion of entire course of antibiotics.  Discussed potential side effects/adverse reactions which can occur.  If symptoms do persist despite antibiotic therapy, recommend returning to the office for further evaluation

## 2023-04-02 NOTE — Progress Notes (Signed)
    Procedures performed today:    None.  Independent interpretation of notes and tests performed by another provider:   None.  Brief History, Exam, Impression, and Recommendations:    BP (!) 168/96   Pulse 74   Ht 6\' 3"  (1.905 m)   Wt 263 lb 12.8 oz (119.7 kg)   SpO2 98%   BMI 32.97 kg/m   Acute otitis media, unspecified otitis media type Assessment & Plan: Patient reports that he woke up during the night with noted left ear pain.  He has associated fullness/pressure in left ear.  No symptoms with right ear.  He reports that he did get sick from family member about 2 weeks ago and is still having some ongoing sinus congestion currently.  Has not had any fever, chills, sweats.  Has not utilized any OTC medications. On exam, patient is in no acute distress, vital signs stable, patient is afebrile.  Right ear with clear external auditory canal, normal-appearing tympanic membrane.  Left ear with small amount of cerumen noted within the EAC, tympanic membrane with mild bulging, peripheral erythema around edges of TM. Given current symptoms, findings on physical exam, would be reasonable to proceed with antibiotic therapy at this time.  Will proceed with Augmentin as below.  Instructed on proper administration of medication, ensuring completion of entire course of antibiotics.  Discussed potential side effects/adverse reactions which can occur.  If symptoms do persist despite antibiotic therapy, recommend returning to the office for further evaluation   Other orders -     Amoxicillin-Pot Clavulanate; Take 1 tablet by mouth 2 (two) times daily.  Dispense: 14 tablet; Refill: 0  Patient is overdue for follow-up of chronic medical conditions.  He is due for recheck of hemoglobin A1c at this time.  Discussed with patient.  He would prefer to hold off on checking A1c today.  Will plan to check A1c at follow-up office visit.  He was advised on following up about 3 to 4 months ago in the office.   Given this, recommend close follow-up within the next 3 to 4 weeks to review chronic medical conditions and update any necessary lab work, test, screening.  Return in about 3 weeks (around 04/23/2023) for diabetes, hypertension.   ___________________________________________ Rashaad Hallstrom de [place], MD, ABFM, CAQSM Primary Care and Sports Medicine [facility]

## 2023-04-07 ENCOUNTER — Telehealth (HOSPITAL_BASED_OUTPATIENT_CLINIC_OR_DEPARTMENT_OTHER): Payer: Self-pay | Admitting: Family Medicine

## 2023-04-07 ENCOUNTER — Ambulatory Visit (INDEPENDENT_AMBULATORY_CARE_PROVIDER_SITE_OTHER): Payer: BC Managed Care – PPO | Admitting: Family Medicine

## 2023-04-07 ENCOUNTER — Encounter (HOSPITAL_BASED_OUTPATIENT_CLINIC_OR_DEPARTMENT_OTHER): Payer: Self-pay | Admitting: Family Medicine

## 2023-04-07 ENCOUNTER — Other Ambulatory Visit (HOSPITAL_BASED_OUTPATIENT_CLINIC_OR_DEPARTMENT_OTHER): Payer: Self-pay

## 2023-04-07 VITALS — BP 159/95 | HR 83 | Ht 75.0 in | Wt 265.0 lb

## 2023-04-07 DIAGNOSIS — H66002 Acute suppurative otitis media without spontaneous rupture of ear drum, left ear: Secondary | ICD-10-CM

## 2023-04-07 DIAGNOSIS — I10 Essential (primary) hypertension: Secondary | ICD-10-CM | POA: Diagnosis not present

## 2023-04-07 MED ORDER — CEFDINIR 300 MG PO CAPS
300.0000 mg | ORAL_CAPSULE | Freq: Two times a day (BID) | ORAL | 0 refills | Status: DC
  Filled 2023-04-07: qty 14, 7d supply, fill #0

## 2023-04-07 NOTE — Progress Notes (Signed)
Subjective:   Benjamin Fletcher 10-16-1960 04/07/2023  Chief Complaint  Patient presents with   Otitis Media    Pt believes he still has infection in left ear. Was recently prescribed abx which has not fully cleared up the infection.    HPI: Benjamin Fletcher presents today for re-assessment left sided acute otitis media. He has been taking augmentin x 1 week without improvement. He reports ongoing ear pain and fullness.   OTALGIA: Onset: 2 weeks ago Location: Left Ear   Sensation of fullness:  yes Ear discharge:  no URI symptoms:  yes Fever:   no Tinnitus: yes Dizziness:  yes Hearing loss:  no Headache:  yes Toothache:  no Rash:  no  Treatments tried: Augmentin (has 1 day left of therapy)   Red Flags Recent trauma:  no PMH recurrent OM:  no PMH prior ear surgery: no Tubes currently:  no Recent antibiotic usage (last 30 days): yes Diabetes or Immunosuppresion:   yes, diabetic    The following portions of the patient's history were reviewed and updated as appropriate: past medical history, past surgical history, family history, social history, allergies, medications, and problem list.   Patient Active Problem List   Diagnosis Date Noted   Otitis media 04/02/2023   Chronic right hip pain 09/05/2022   Encounter for medical examination to establish care 08/06/2021   Hyperlipidemia associated with type 2 diabetes mellitus (HCC) 08/06/2021   Type 2 diabetes mellitus without complication, without long-term current use of insulin (HCC) 08/06/2021   Laryngopharyngeal reflux (LPR) 05/04/2020   Essential hypertension 04/11/2020   Malignant neoplasm of prostate ([facility]CC) 03/04/2016   OSA (obstructive sleep apnea) 05/08/2011   Past Medical History:  Diagnosis Date   Hypertension    Prostate cancer ([facility])    Skin cancer    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   Sleep apnea    Past Surgical History:  Procedure  Laterality Date   PROSTATE BIOPSY     skin cancer excised x3     TONSILLECTOMY     Family History  Problem Relation Age of Onset   Heart disease Father        CABG   Cancer Father        prostate   Heart attack Father    Hyperlipidemia Father    Hypertension Mother    Cancer Brother        kidney and prostate   Hyperlipidemia Brother    Diabetes Brother    Hyperlipidemia Brother    Kidney disease Brother    Outpatient Medications Prior to Visit  Medication Sig Dispense Refill   hydrochlorothiazide (HYDRODIURIL) 25 MG tablet TAKE 1 TABLET (25 MG TOTAL) BY MOUTH DAILY. 90 tablet 3   losartan (COZAAR) 100 MG tablet TAKE 1 TABLET BY MOUTH EVERY DAY 90 tablet 1   metFORMIN (GLUCOPHAGE) 500 MG tablet TAKE 1 TABLET BY MOUTH 2 TIMES DAILY WITH A MEAL. 180 tablet 3   amoxicillin-clavulanate (AUGMENTIN) 875-125 MG tablet Take 1 tablet by mouth 2 (two) times daily. 14 tablet 0   No facility-administered medications prior to visit.   No Known Allergies   ROS: A complete ROS was performed with pertinent positives/negatives noted in the HPI. The remainder of the ROS are negative.    Objective:   Today's Vitals   04/07/23 1610 04/07/23 1632  BP: (!) 153/96 (!) 159/95  Pulse: 83   SpO2: 99%  Weight: 265 lb (120.2 kg)   Height: 6\' 3"  (1.905 m)     Physical Exam        GENERAL: Well-appearing, in NAD. Well nourished.  SKIN: Pink, warm and dry. No rash, lesion, ulceration, or ecchymoses.  Head: Normocephalic. NECK: Trachea midline. Full ROM w/o pain or tenderness. No lymphadenopathy.  EARS: Right Tympanic membrane is intact, translucent without bulging and without drainage. Left TM is bulging, red but intact. Erythema to external ear canal is present. No drainage. Appropriate landmarks visualized.  EYES: Conjunctiva clear without exudates. EOMI, PERRL, no drainage present.  THROAT: Uvula midline. Oropharynx clear. Mucous membranes pink and moist.  RESPIRATORY: Chest wall  symmetrical.  EXTREMITIES: Without clubbing, cyanosis, or edema.  NEUROLOGIC: No motor or sensory deficits. Steady, even gait. C2-C12 intact.  PSYCH/MENTAL STATUS: Alert, oriented x 3. Cooperative, appropriate mood and affect.   Health Maintenance Due  Topic Date Due   HIV Screening  Never done   Zoster Vaccines- Shingrix (1 of 2) Never done   COVID-19 Vaccine (3 - Pfizer risk series) 02/26/2020   FOOT EXAM  05/03/2021   HEMOGLOBIN A1C  03/07/2023    No results found for any visits on 04/07/23.  The ASCVD Risk score (Arnett DK, et al., 2019) failed to calculate for the following reasons:   Unable to determine if patient is Non-Hispanic African American     Assessment & Plan:  1. Non-recurrent acute suppurative otitis media of left ear without spontaneous rupture of tympanic membrane Stop Augmentin. Start Cefinir 300mg  BID x 7 days. Use Probiotic daily. If no improvement in 48 hours, reach out to PCP and will advise for Rocephin IM.   2. Primary Hypertension Patient states that BP "goes up" with each recheck. Asymptomatic. He has follow up with Dr. Ihor Dow in 2 weeks. Recommend to monitor BP at home with goal less than 140/90. Continue Losartan and hydrochlorothiazide as prescribed. Reach out to office if not controlled prior to follow up.   Meds ordered this encounter  Medications   cefdinir (OMNICEF) 300 MG capsule    Sig: Take 1 capsule (300 mg total) by mouth 2 (two) times daily.    Dispense:  14 capsule    Refill:  0    Order Specific Question:   Supervising Provider    Answer:   DE [place], RAYMOND J [id number]   Lab Orders  No laboratory test(s) ordered today   No images are attached to the encounter or orders placed in the encounter.  Return if symptoms worsen or fail to improve.    Patient to reach out to office if new, worrisome, or unresolved symptoms arise or if no improvement in patient's condition. Patient verbalized understanding and is agreeable to treatment  plan. All questions answered to patient's satisfaction.   Of note, portions of this note may have been created with voice recognition software Physicist, medical). While this note has been edited for accuracy, occasional wrong-word or 'sound-a-like' substitutions may have occurred due to the inherent limitations of voice recognition software.  Yolanda Manges, FNP

## 2023-04-07 NOTE — Patient Instructions (Addendum)
Please pick up a probiotic (Such as culturelle or Align) at the pharmacy to take daily with your antibiotic.    If no improvement in 2-3 days, call the office.

## 2023-04-07 NOTE — Telephone Encounter (Signed)
Patient called in and said the ear drops are not working, can he be prescribed an antibiotic? Please advise patient

## 2023-04-07 NOTE — Telephone Encounter (Signed)
Called and spoke with patient, scheduled to come back in and be seen today. Almost completed antibiotic and has had no relief

## 2023-04-16 ENCOUNTER — Encounter (HOSPITAL_BASED_OUTPATIENT_CLINIC_OR_DEPARTMENT_OTHER): Payer: Self-pay | Admitting: Family Medicine

## 2023-04-16 ENCOUNTER — Ambulatory Visit (INDEPENDENT_AMBULATORY_CARE_PROVIDER_SITE_OTHER): Payer: BC Managed Care – PPO | Admitting: Family Medicine

## 2023-04-16 VITALS — BP 166/89 | HR 70 | Ht 75.0 in | Wt 265.0 lb

## 2023-04-16 DIAGNOSIS — J069 Acute upper respiratory infection, unspecified: Secondary | ICD-10-CM

## 2023-04-16 DIAGNOSIS — R0981 Nasal congestion: Secondary | ICD-10-CM

## 2023-04-16 NOTE — Progress Notes (Signed)
Acute Office Visit  Subjective:     Patient ID: Benjamin Fletcher, male    DOB: [birth year], 62 y.o.   MRN: [id number]  Chief Complaint  Patient presents with   Ear Pain    Feels pressure in left ear, finished 2 antibiotics, slight congestion   Benjamin Fletcher is a 62 year-old male patient who presents today for re-assessment left sided acute otitis media. He was initially prescribed augmentin x 1 week without improvement and then seen in the office on 04/07/2023 and prescribed cefdinir x 1 week . He reports ongoing pressure and fullness. Felt some drainage in his throat for a day- noticed slight sore throat but now notes it has improved.   Using antihistamines & Afrin-- just started taking them last night   Ear Pressure: Onset: 07/03 Location: Left Ear   Sensation of fullness:  yes, pressure  Ear discharge:  no URI symptoms:  yes Fever:   no Tinnitus: no Dizziness:  no Hearing loss/muffled sounds:  yes Sinus pressure/headache: yes, mainly behind his eyes  Nasal congestion: yes  Headache:  yes Toothache:  no Rash:  no   Treatments tried: Augmentin & Cefdinir    Red Flags Recent trauma:  no PMH recurrent OM:  no PMH prior ear surgery: no Tubes currently:  no Recent antibiotic usage (last 30 days): yes Diabetes or Immunosuppresion:   yes, diabetic    Review of Systems  Constitutional:  Negative for chills, fever and malaise/fatigue.  HENT:  Positive for congestion, hearing loss and sinus pain. Negative for ear discharge, ear pain, sore throat and tinnitus.   Eyes:  Positive for pain (pressure). Negative for discharge and redness.  Respiratory:  Negative for cough and shortness of breath.   Cardiovascular:  Negative for chest pain and palpitations.  Neurological:  Negative for weakness and headaches.      Objective:    BP (!) 166/89   Pulse 70   Ht 6\' 3"  (1.905 m)   Wt 265 lb (120.2 kg)   SpO2 99%   BMI 33.12 kg/m   Physical Exam Constitutional:       Appearance: Normal appearance.  HENT:     Right Ear: Tympanic membrane, ear canal and external ear normal.     Left Ear: Tympanic membrane, ear canal and external ear normal.     Nose: Congestion and rhinorrhea present. Rhinorrhea is clear.     Right Sinus: No maxillary sinus tenderness or frontal sinus tenderness.     Left Sinus: No maxillary sinus tenderness or frontal sinus tenderness.     Mouth/Throat:     Lips: Pink.     Mouth: Mucous membranes are moist.     Pharynx: No posterior oropharyngeal erythema.     Tonsils: No tonsillar exudate or tonsillar abscesses.  Cardiovascular:     Rate and Rhythm: Normal rate and regular rhythm.     Heart sounds: Normal heart sounds.  Pulmonary:     Effort: Pulmonary effort is normal.     Breath sounds: Normal breath sounds.  Lymphadenopathy:     Cervical: No cervical adenopathy.  Neurological:     Mental Status: He is alert.        Assessment & Plan:   1. Sinus congestion Patient presents today with left ear pressure and fullness. He completed 7 days of Augmentin and 7 days of cefdinir due to left ear AOM. Reports he is also experiencing muffled sounds, sinus pressure, nasal congestion, headache and rhinorrhea. The pinna, tragus,  and ear canal are non-tender and without swelling. The ear canal is clear without cerumen impaction and discharge. The left and right tympanic membranes are translucent, pearly grey, and shiny with no bulging or retraction. Normal in appearance with good cone-shaped light reflection of light and smooth consistency. Landmarks are clearly visible. Hearing is intact. No maxillary or frontal sinus tenderness present during palpation. Advised patient no antibiotic is necessary at this time. Provided reassurance to patient that no infection, fluid or blood accumulation, swelling, or perforation is present. Advised patient vestibular symptoms from may take time for resolution. Counseled patient about symptomatic treatment-  including PO antihistamine, nasal cromolyn nasal spray or nasal glucocorticosteroid, and use of a decongestant short-term. Advised patient if he does not improve in 7-10 days, would be reasonable to treat for an acute sinus infection.    Return if symptoms worsen or fail to improve.  Alyson Reedy, FNP

## 2023-04-25 ENCOUNTER — Ambulatory Visit (INDEPENDENT_AMBULATORY_CARE_PROVIDER_SITE_OTHER): Payer: BC Managed Care – PPO | Admitting: Family Medicine

## 2023-04-25 ENCOUNTER — Other Ambulatory Visit (HOSPITAL_BASED_OUTPATIENT_CLINIC_OR_DEPARTMENT_OTHER): Payer: Self-pay

## 2023-04-25 ENCOUNTER — Encounter (HOSPITAL_BASED_OUTPATIENT_CLINIC_OR_DEPARTMENT_OTHER): Payer: Self-pay | Admitting: Family Medicine

## 2023-04-25 VITALS — BP 150/87 | HR 73 | Ht 75.0 in | Wt 260.0 lb

## 2023-04-25 DIAGNOSIS — E119 Type 2 diabetes mellitus without complications: Secondary | ICD-10-CM | POA: Diagnosis not present

## 2023-04-25 DIAGNOSIS — I10 Essential (primary) hypertension: Secondary | ICD-10-CM | POA: Diagnosis not present

## 2023-04-25 LAB — HEMOGLOBIN A1C
Est. average glucose Bld gHb Est-mCnc: 131 mg/dL
Hgb A1c MFr Bld: 6.2 % — ABNORMAL HIGH (ref 4.8–5.6)

## 2023-04-25 MED ORDER — AMLODIPINE BESYLATE 5 MG PO TABS
5.0000 mg | ORAL_TABLET | Freq: Every day | ORAL | 1 refills | Status: DC
  Filled 2023-04-25: qty 90, 90d supply, fill #0

## 2023-04-25 NOTE — Progress Notes (Signed)
    Procedures performed today:    None.  Independent interpretation of notes and tests performed by another provider:   None.  Brief History, Exam, Impression, and Recommendations:    BP (!) 150/87   Pulse 73   Ht 6\' 3"  (1.905 m)   Wt 260 lb (117.9 kg)   SpO2 97%   BMI 32.50 kg/m   Type 2 diabetes mellitus without complication, without long-term current use of insulin (HCC) Assessment & Plan: Manages currently with use of metformin, 500 mg twice daily.  Recent hemoglobin A1c has shown good control, due for recheck today.  Denies any new symptoms or issues such as polyuria or polydipsia. For now, can continue with metformin Will check hemoglobin A1c today Foot exam completed today Patient likely would benefit from statin therapy, we will need to update labs and further discuss at future visits  Orders: -     Hemoglobin A1c  Essential hypertension Assessment & Plan: Blood pressure elevated in office today.  Recent office visit have also shown persistently elevated blood pressure.  He has not been checking blood pressure regularly at home. Denies any issues with chest pain or headaches, no lightheadedness or dizziness.  He continues with hydrochlorothiazide and losartan. Will proceed with addition of amlodipine to current regimen.  Continue with losartan and hydrochlorothiazide as prescribed Recommend intermittent monitoring of blood pressure at home, DASH diet   Other orders -     amLODIPine Besylate; Take 1 tablet (5 mg total) by mouth daily.  Dispense: 90 tablet; Refill: 1  Return in about 6 weeks (around 06/06/2023) for hypertension, med check. Check lipid panel in near future for further assessment and to further discuss statin consideration   ___________________________________________ Krisinda Giovanni de [place], MD, ABFM, CAQSM Primary Care and Sports Medicine [facility]

## 2023-04-25 NOTE — Assessment & Plan Note (Signed)
Manages currently with use of metformin, 500 mg twice daily.  Recent hemoglobin A1c has shown good control, due for recheck today.  Denies any new symptoms or issues such as polyuria or polydipsia. For now, can continue with metformin Will check hemoglobin A1c today Foot exam completed today Patient likely would benefit from statin therapy, we will need to update labs and further discuss at future visits

## 2023-04-25 NOTE — Assessment & Plan Note (Signed)
Blood pressure elevated in office today.  Recent office visit have also shown persistently elevated blood pressure.  He has not been checking blood pressure regularly at home. Denies any issues with chest pain or headaches, no lightheadedness or dizziness.  He continues with hydrochlorothiazide and losartan. Will proceed with addition of amlodipine to current regimen.  Continue with losartan and hydrochlorothiazide as prescribed Recommend intermittent monitoring of blood pressure at home, DASH diet

## 2023-05-01 ENCOUNTER — Other Ambulatory Visit: Payer: Self-pay | Admitting: Urology

## 2023-05-01 DIAGNOSIS — C61 Malignant neoplasm of prostate: Secondary | ICD-10-CM

## 2023-05-01 DIAGNOSIS — R972 Elevated prostate specific antigen [PSA]: Secondary | ICD-10-CM

## 2023-05-13 ENCOUNTER — Encounter: Payer: Self-pay | Admitting: Urology

## 2023-06-12 ENCOUNTER — Other Ambulatory Visit: Payer: Self-pay

## 2023-06-12 ENCOUNTER — Ambulatory Visit (HOSPITAL_BASED_OUTPATIENT_CLINIC_OR_DEPARTMENT_OTHER): Payer: BC Managed Care – PPO | Admitting: Family Medicine

## 2023-06-12 ENCOUNTER — Other Ambulatory Visit (HOSPITAL_BASED_OUTPATIENT_CLINIC_OR_DEPARTMENT_OTHER): Payer: Self-pay

## 2023-06-12 ENCOUNTER — Encounter (HOSPITAL_BASED_OUTPATIENT_CLINIC_OR_DEPARTMENT_OTHER): Payer: Self-pay | Admitting: Family Medicine

## 2023-06-12 VITALS — BP 148/92 | HR 73 | Ht 74.5 in | Wt 262.1 lb

## 2023-06-12 DIAGNOSIS — I10 Essential (primary) hypertension: Secondary | ICD-10-CM | POA: Diagnosis not present

## 2023-06-12 DIAGNOSIS — Z1211 Encounter for screening for malignant neoplasm of colon: Secondary | ICD-10-CM | POA: Diagnosis not present

## 2023-06-12 DIAGNOSIS — Z7984 Long term (current) use of oral hypoglycemic drugs: Secondary | ICD-10-CM

## 2023-06-12 DIAGNOSIS — E119 Type 2 diabetes mellitus without complications: Secondary | ICD-10-CM

## 2023-06-12 MED ORDER — HYDROCHLOROTHIAZIDE 25 MG PO TABS
25.0000 mg | ORAL_TABLET | Freq: Every day | ORAL | 3 refills | Status: DC
  Filled 2023-06-12 – 2023-08-06 (×2): qty 90, 90d supply, fill #0

## 2023-06-12 MED ORDER — METFORMIN HCL 500 MG PO TABS
500.0000 mg | ORAL_TABLET | Freq: Two times a day (BID) | ORAL | 3 refills | Status: DC
  Filled 2023-06-12 – 2023-08-06 (×2): qty 180, 90d supply, fill #0

## 2023-06-12 MED ORDER — AMLODIPINE BESYLATE 10 MG PO TABS
10.0000 mg | ORAL_TABLET | Freq: Every day | ORAL | 1 refills | Status: DC
  Filled 2023-06-12: qty 90, 90d supply, fill #0
  Filled 2023-08-06 – 2023-09-05 (×4): qty 90, 90d supply, fill #1

## 2023-06-12 MED ORDER — LOSARTAN POTASSIUM 100 MG PO TABS
100.0000 mg | ORAL_TABLET | Freq: Every day | ORAL | 1 refills | Status: DC
  Filled 2023-06-12 – 2023-08-08 (×4): qty 90, 90d supply, fill #0

## 2023-06-12 NOTE — Assessment & Plan Note (Addendum)
Blood pressure elevated in office today.  At last visit, we did add amlodipine to his regimen.  He indicates that he started by taking half of a tablet and then progressed to full 5 mg tablet.  He has been taking full tablet for about 3 to 4 weeks now..  He has not been checking blood pressure regularly at home, although he does have 2 blood pressure cuff at home. Denies any issues with chest pain or headaches, no lightheadedness or dizziness.  He continues with hydrochlorothiazide and losartan. Given continued elevation here in the office, we will proceed with titration of amlodipine today.  Continue with losartan and hydrochlorothiazide as prescribed.  Caution on potential side effects with titrating dose of amlodipine Recommend intermittent monitoring of blood pressure at home, DASH diet

## 2023-06-12 NOTE — Assessment & Plan Note (Signed)
Manages currently with use of metformin, 500 mg twice daily.  Recent hemoglobin A1c has shown good control.  Denies any new symptoms or issues such as polyuria or polydipsia. For now, can continue with metformin Will check hemoglobin A1c at future office visit Patient likely would benefit from statin therapy, we will need to update labs and further discuss at future visits

## 2023-06-12 NOTE — Progress Notes (Signed)
    Procedures performed today:    None.  Independent interpretation of notes and tests performed by another provider:   None.  Brief History, Exam, Impression, and Recommendations:    BP (!) 152/98 (BP Location: Right Arm, Patient Position: Sitting, Cuff Size: Normal)   Pulse 73   Ht 6' 2.5" (1.892 m)   Wt 262 lb 1.6 oz (118.9 kg)   SpO2 96%   BMI 33.20 kg/m   Colon cancer screening -     Cologuard  Essential hypertension Assessment & Plan: Blood pressure elevated in office today.  At last visit, we did add amlodipine to his regimen.  He indicates that he started by taking half of a tablet and then progressed to full 5 mg tablet.  He has been taking full tablet for about 3 to 4 weeks now..  He has not been checking blood pressure regularly at home, although he does have 2 blood pressure cuff at home. Denies any issues with chest pain or headaches, no lightheadedness or dizziness.  He continues with hydrochlorothiazide and losartan. Given continued elevation here in the office, we will proceed with titration of amlodipine today.  Continue with losartan and hydrochlorothiazide as prescribed.  Caution on potential side effects with titrating dose of amlodipine Recommend intermittent monitoring of blood pressure at home, DASH diet  Orders: -     hydroCHLOROthiazide; Take 1 tablet (25 mg total) by mouth daily.  Dispense: 90 tablet; Refill: 3 -     Losartan Potassium; Take 1 tablet (100 mg total) by mouth daily.  Dispense: 90 tablet; Refill: 1  Type 2 diabetes mellitus without complication, without long-term current use of insulin (HCC) Assessment & Plan: Manages currently with use of metformin, 500 mg twice daily.  Recent hemoglobin A1c has shown good control.  Denies any new symptoms or issues such as polyuria or polydipsia. For now, can continue with metformin Will check hemoglobin A1c at future office visit Patient likely would benefit from statin therapy, we will need to update  labs and further discuss at future visits  Orders: -     metFORMIN HCl; Take 1 tablet (500 mg total) by mouth 2 (two) times daily with a meal.  Dispense: 180 tablet; Refill: 3  Other orders -     amLODIPine Besylate; Take 1 tablet (10 mg total) by mouth daily.  Dispense: 90 tablet; Refill: 1  Return in about 6 weeks (around 07/24/2023).   ___________________________________________ Xzaria Teo de [place], MD, ABFM, CAQSM Primary Care and Sports Medicine [facility]

## 2023-06-25 DIAGNOSIS — R351 Nocturia: Secondary | ICD-10-CM | POA: Diagnosis not present

## 2023-06-25 DIAGNOSIS — N401 Enlarged prostate with lower urinary tract symptoms: Secondary | ICD-10-CM | POA: Diagnosis not present

## 2023-07-02 DIAGNOSIS — N401 Enlarged prostate with lower urinary tract symptoms: Secondary | ICD-10-CM | POA: Diagnosis not present

## 2023-07-02 DIAGNOSIS — R351 Nocturia: Secondary | ICD-10-CM | POA: Diagnosis not present

## 2023-07-02 DIAGNOSIS — C61 Malignant neoplasm of prostate: Secondary | ICD-10-CM | POA: Diagnosis not present

## 2023-07-03 DIAGNOSIS — Z1211 Encounter for screening for malignant neoplasm of colon: Secondary | ICD-10-CM | POA: Diagnosis not present

## 2023-07-04 ENCOUNTER — Other Ambulatory Visit: Payer: Self-pay | Admitting: Family Medicine

## 2023-07-04 DIAGNOSIS — Z1212 Encounter for screening for malignant neoplasm of rectum: Secondary | ICD-10-CM

## 2023-07-04 DIAGNOSIS — Z1211 Encounter for screening for malignant neoplasm of colon: Secondary | ICD-10-CM

## 2023-07-09 LAB — COLOGUARD: COLOGUARD: POSITIVE — AB

## 2023-07-15 ENCOUNTER — Other Ambulatory Visit (HOSPITAL_BASED_OUTPATIENT_CLINIC_OR_DEPARTMENT_OTHER): Payer: Self-pay | Admitting: Family Medicine

## 2023-07-15 DIAGNOSIS — R195 Other fecal abnormalities: Secondary | ICD-10-CM

## 2023-07-24 ENCOUNTER — Ambulatory Visit (HOSPITAL_BASED_OUTPATIENT_CLINIC_OR_DEPARTMENT_OTHER): Payer: BC Managed Care – PPO | Admitting: Family Medicine

## 2023-07-31 ENCOUNTER — Other Ambulatory Visit (HOSPITAL_BASED_OUTPATIENT_CLINIC_OR_DEPARTMENT_OTHER): Payer: Self-pay

## 2023-07-31 MED ORDER — FLULAVAL 0.5 ML IM SUSY
0.5000 mL | PREFILLED_SYRINGE | Freq: Once | INTRAMUSCULAR | 0 refills | Status: AC
  Filled 2023-07-31: qty 0.5, 1d supply, fill #0

## 2023-08-06 ENCOUNTER — Other Ambulatory Visit: Payer: Self-pay

## 2023-08-06 ENCOUNTER — Other Ambulatory Visit (HOSPITAL_BASED_OUTPATIENT_CLINIC_OR_DEPARTMENT_OTHER): Payer: Self-pay

## 2023-08-07 ENCOUNTER — Other Ambulatory Visit (HOSPITAL_BASED_OUTPATIENT_CLINIC_OR_DEPARTMENT_OTHER): Payer: Self-pay

## 2023-08-08 ENCOUNTER — Other Ambulatory Visit: Payer: Self-pay

## 2023-08-08 ENCOUNTER — Other Ambulatory Visit (HOSPITAL_BASED_OUTPATIENT_CLINIC_OR_DEPARTMENT_OTHER): Payer: Self-pay

## 2023-08-18 DIAGNOSIS — R195 Other fecal abnormalities: Secondary | ICD-10-CM | POA: Diagnosis not present

## 2023-08-19 ENCOUNTER — Encounter: Payer: Self-pay | Admitting: Urology

## 2023-08-21 ENCOUNTER — Encounter (HOSPITAL_BASED_OUTPATIENT_CLINIC_OR_DEPARTMENT_OTHER): Payer: Self-pay | Admitting: Family Medicine

## 2023-08-24 ENCOUNTER — Other Ambulatory Visit (HOSPITAL_BASED_OUTPATIENT_CLINIC_OR_DEPARTMENT_OTHER): Payer: Self-pay | Admitting: Family Medicine

## 2023-08-24 ENCOUNTER — Ambulatory Visit
Admission: RE | Admit: 2023-08-24 | Discharge: 2023-08-24 | Disposition: A | Discharge status: Home / Self Care | Payer: BC Managed Care – PPO | Source: Ambulatory Visit | Attending: Urology | Admitting: Urology

## 2023-08-24 DIAGNOSIS — E119 Type 2 diabetes mellitus without complications: Secondary | ICD-10-CM

## 2023-08-24 DIAGNOSIS — R972 Elevated prostate specific antigen [PSA]: Secondary | ICD-10-CM

## 2023-08-24 DIAGNOSIS — C61 Malignant neoplasm of prostate: Secondary | ICD-10-CM

## 2023-08-24 DIAGNOSIS — I10 Essential (primary) hypertension: Secondary | ICD-10-CM

## 2023-08-24 MED ORDER — GADOPICLENOL 0.5 MMOL/ML IV SOLN
10.0000 mL | Freq: Once | INTRAVENOUS | Status: AC | PRN
  Administered 2023-08-24: 10 mL via INTRAVENOUS

## 2023-08-27 ENCOUNTER — Other Ambulatory Visit (HOSPITAL_BASED_OUTPATIENT_CLINIC_OR_DEPARTMENT_OTHER): Payer: Self-pay | Admitting: Family Medicine

## 2023-08-27 DIAGNOSIS — I10 Essential (primary) hypertension: Secondary | ICD-10-CM

## 2023-09-01 DIAGNOSIS — R195 Other fecal abnormalities: Secondary | ICD-10-CM | POA: Diagnosis not present

## 2023-09-01 DIAGNOSIS — Z1211 Encounter for screening for malignant neoplasm of colon: Secondary | ICD-10-CM | POA: Diagnosis not present

## 2023-09-01 DIAGNOSIS — D124 Benign neoplasm of descending colon: Secondary | ICD-10-CM | POA: Diagnosis not present

## 2023-09-01 DIAGNOSIS — K648 Other hemorrhoids: Secondary | ICD-10-CM | POA: Diagnosis not present

## 2023-09-01 LAB — HM COLONOSCOPY

## 2023-09-05 ENCOUNTER — Other Ambulatory Visit (HOSPITAL_BASED_OUTPATIENT_CLINIC_OR_DEPARTMENT_OTHER): Payer: Self-pay

## 2023-09-08 ENCOUNTER — Other Ambulatory Visit (HOSPITAL_BASED_OUTPATIENT_CLINIC_OR_DEPARTMENT_OTHER): Payer: Self-pay

## 2023-09-11 ENCOUNTER — Encounter (HOSPITAL_BASED_OUTPATIENT_CLINIC_OR_DEPARTMENT_OTHER): Payer: Self-pay | Admitting: *Deleted

## 2023-10-21 ENCOUNTER — Ambulatory Visit: Payer: BC Managed Care – PPO | Admitting: Gastroenterology

## 2023-10-22 ENCOUNTER — Encounter (HOSPITAL_BASED_OUTPATIENT_CLINIC_OR_DEPARTMENT_OTHER): Payer: Self-pay | Admitting: *Deleted

## 2023-12-04 ENCOUNTER — Encounter (HOSPITAL_BASED_OUTPATIENT_CLINIC_OR_DEPARTMENT_OTHER): Payer: Self-pay | Admitting: *Deleted

## 2023-12-17 DIAGNOSIS — H2513 Age-related nuclear cataract, bilateral: Secondary | ICD-10-CM | POA: Diagnosis not present

## 2023-12-17 DIAGNOSIS — E119 Type 2 diabetes mellitus without complications: Secondary | ICD-10-CM | POA: Diagnosis not present

## 2023-12-17 LAB — HM DIABETES EYE EXAM

## 2023-12-23 ENCOUNTER — Encounter (HOSPITAL_BASED_OUTPATIENT_CLINIC_OR_DEPARTMENT_OTHER): Payer: Self-pay | Admitting: *Deleted

## 2023-12-24 DIAGNOSIS — C61 Malignant neoplasm of prostate: Secondary | ICD-10-CM | POA: Diagnosis not present

## 2023-12-31 DIAGNOSIS — C61 Malignant neoplasm of prostate: Secondary | ICD-10-CM | POA: Diagnosis not present

## 2024-01-01 ENCOUNTER — Ambulatory Visit (HOSPITAL_BASED_OUTPATIENT_CLINIC_OR_DEPARTMENT_OTHER): Admitting: Family Medicine

## 2024-01-05 ENCOUNTER — Ambulatory Visit (INDEPENDENT_AMBULATORY_CARE_PROVIDER_SITE_OTHER): Admitting: Family Medicine

## 2024-01-05 ENCOUNTER — Encounter (HOSPITAL_BASED_OUTPATIENT_CLINIC_OR_DEPARTMENT_OTHER): Payer: Self-pay | Admitting: Family Medicine

## 2024-01-05 ENCOUNTER — Other Ambulatory Visit (HOSPITAL_BASED_OUTPATIENT_CLINIC_OR_DEPARTMENT_OTHER): Payer: Self-pay

## 2024-01-05 VITALS — BP 153/89 | HR 82 | Ht 75.0 in | Wt 261.3 lb

## 2024-01-05 DIAGNOSIS — I10 Essential (primary) hypertension: Secondary | ICD-10-CM

## 2024-01-05 DIAGNOSIS — R195 Other fecal abnormalities: Secondary | ICD-10-CM | POA: Insufficient documentation

## 2024-01-05 DIAGNOSIS — Z7984 Long term (current) use of oral hypoglycemic drugs: Secondary | ICD-10-CM | POA: Diagnosis not present

## 2024-01-05 DIAGNOSIS — E119 Type 2 diabetes mellitus without complications: Secondary | ICD-10-CM | POA: Diagnosis not present

## 2024-01-05 LAB — POCT GLYCOSYLATED HEMOGLOBIN (HGB A1C)
HbA1c POC (<> result, manual entry): 10.3 % (ref 4.0–5.6)
Hemoglobin A1C: 10.3 % — AB (ref 4.0–5.6)

## 2024-01-05 MED ORDER — LOSARTAN POTASSIUM 100 MG PO TABS
100.0000 mg | ORAL_TABLET | Freq: Every day | ORAL | 1 refills | Status: DC
  Filled 2024-01-05 – 2024-03-30 (×2): qty 90, 90d supply, fill #0

## 2024-01-05 MED ORDER — AMLODIPINE BESYLATE 10 MG PO TABS
10.0000 mg | ORAL_TABLET | Freq: Every day | ORAL | 1 refills | Status: DC
  Filled 2024-01-05: qty 90, 90d supply, fill #0
  Filled 2024-03-30: qty 90, 90d supply, fill #1

## 2024-01-05 NOTE — Assessment & Plan Note (Signed)
 Patient continues with metformin, denies any issue with medication.  Denies any concerns related to polyuria or polydipsia.  Feels that he may have had some symptoms with increased sugar intake.  Has noted feeling off when eating foods higher in sugar such as desserts. Patient is overdue for follow-up and labs including urine microalbumin/creatinine ratio as well as hemoglobin A1c. Plan of care A1c completed today and is notably elevated at 10.3%.  Discussed options including lifestyle modifications as well as medication considerations.  He is on lower dose of metformin so option would be to go up on dose of metformin to assist with blood sugar control.  Did discuss that with elevation of A1c, it is unlikely to be able to get there with lifestyle modifications and metformin alone.  We did review medication considerations.  After discussion of different options including oral and injectable options and risk and benefits related with each, patient elected to proceed with

## 2024-01-05 NOTE — Assessment & Plan Note (Signed)
 Blood pressure continues to be above goal despite current use of 3 antihypertensive medications at maximal dose.  As below, hemoglobin A1c found to be notably elevated.  It is possible that elevated blood sugars could be contributing to elevated blood pressure as well.  Patient has not been checking blood pressure at home We discussed considerations and patient would be amenable to referral to cardiology with assistance in controlling blood pressure, referral placed today

## 2024-01-05 NOTE — Patient Instructions (Signed)
  Medication Instructions:  Your physician recommends that you continue on your current medications as directed. Please refer to the Current Medication list given to you today. --If you need a refill on any your medications before your next appointment, please call your pharmacy first. If no refills are authorized on file call the office.-- Lab Work: Your physician has recommended that you have lab work today: 1 week prior If you have labs (blood work) drawn today and your tests are completely normal, you will receive your results via MyChart message OR a phone call from our staff.  Please ensure you check your voicemail in the event that you authorized detailed messages to be left on a delegated number. If you have any lab test that is abnormal or we need to change your treatment, we will call you to review the results.  Referrals/Procedures/Imaging: Cardiology   Follow-Up: Your next appointment:   Your physician recommends that you schedule a follow-up appointment in: 3-4 mths follow up with Dr. de Peru  You will receive a text message or e-mail with a link to a survey about your care and experience with [place] today! We would greatly appreciate your feedback!   Thanks for letting us be apart of your health journey!!  Primary Care and Sports Medicine   Dr. Ceasar Mons Peru   We encourage you to activate your patient portal called "MyChart".  Sign up information is provided on this After Visit Summary.  MyChart is used to connect with patients for Virtual Visits (Telemedicine).  Patients are able to view lab/test results, encounter notes, upcoming appointments, etc.  Non-urgent messages can be sent to your provider as well. To learn more about what you can do with MyChart, please visit --  [email or web address].

## 2024-01-05 NOTE — Progress Notes (Unsigned)
    Procedures performed today:    None.  Independent interpretation of notes and tests performed by another provider:   None.  Brief History, Exam, Impression, and Recommendations:    BP (!) 153/89 (BP Location: Right Arm, Patient Position: Sitting, Cuff Size: Large)   Pulse 82   Ht 6\' 3"  (1.905 m)   Wt 261 lb 4.8 oz (118.5 kg)   SpO2 97%   BMI 32.66 kg/m   Essential hypertension Assessment & Plan: Blood pressure continues to be above goal despite current use of 3 antihypertensive medications at maximal dose.  As below, hemoglobin A1c found to be notably elevated.  It is possible that elevated blood sugars could be contributing to elevated blood pressure as well.  Patient has not been checking blood pressure at home We discussed considerations and patient would be amenable to referral to cardiology with assistance in controlling blood pressure, referral placed today  Orders: -     Ambulatory referral to Cardiology -     Losartan Potassium; Take 1 tablet (100 mg total) by mouth daily.  Dispense: 90 tablet; Refill: 1  Type 2 diabetes mellitus without complication, without long-term current use of insulin (HCC) Assessment & Plan: Patient continues with metformin, denies any issue with medication.  Denies any concerns related to polyuria or polydipsia.  Feels that he may have had some symptoms with increased sugar intake.  Has noted feeling off when eating foods higher in sugar such as desserts. Patient is overdue for follow-up and labs including urine microalbumin/creatinine ratio as well as hemoglobin A1c. Plan of care A1c completed today and is notably elevated at 10.3%.  Discussed options including lifestyle modifications as well as medication considerations.  He is on lower dose of metformin so option would be to go up on dose of metformin to assist with blood sugar control.  Did discuss that with elevation of A1c, it is unlikely to be able to get there with lifestyle modifications  and metformin alone.  We did review medication considerations.  After discussion of different options including oral and injectable options and risk and benefits related with each, patient elected to proceed with  Orders: -     POCT glycosylated hemoglobin (Hb A1C) -     Microalbumin / creatinine urine ratio  Other orders -     amLODIPine Besylate; Take 1 tablet (10 mg total) by mouth daily.  Dispense: 90 tablet; Refill: 1  Return in about 3 months (around 04/05/2024) for hypertension, diabetes.   ___________________________________________ Raijon Lindfors de [place], MD, ABFM, CAQSM Primary Care and Sports Medicine [facility]

## 2024-01-06 ENCOUNTER — Telehealth (HOSPITAL_BASED_OUTPATIENT_CLINIC_OR_DEPARTMENT_OTHER): Payer: Self-pay | Admitting: *Deleted

## 2024-01-06 NOTE — Telephone Encounter (Signed)
 LVM for pt to call the office regarding A1C result provider would like to speak with patient   Per provider:  with his A1c being as elevated as it was, can we see if he would be able to talk on the phone tomorrow morning at the beginning of the day to review this and recommendations? I can talk with him between [place] appointments in the morning. This would not need to be scheduled as a separate appointment.   Will wait for patient to call the office back

## 2024-01-07 LAB — MICROALBUMIN / CREATININE URINE RATIO
Creatinine, Urine: 25.3 mg/dL
Microalb/Creat Ratio: <12 mg/g{creat} (ref 0–29)
Microalbumin, Urine: <3 ug/mL

## 2024-01-08 ENCOUNTER — Encounter (HOSPITAL_BASED_OUTPATIENT_CLINIC_OR_DEPARTMENT_OTHER): Payer: Self-pay | Admitting: Family Medicine

## 2024-03-30 ENCOUNTER — Other Ambulatory Visit: Payer: Self-pay

## 2024-04-29 ENCOUNTER — Other Ambulatory Visit (HOSPITAL_BASED_OUTPATIENT_CLINIC_OR_DEPARTMENT_OTHER): Payer: Self-pay | Admitting: Family Medicine

## 2024-04-29 DIAGNOSIS — I10 Essential (primary) hypertension: Secondary | ICD-10-CM

## 2024-05-03 ENCOUNTER — Other Ambulatory Visit (HOSPITAL_BASED_OUTPATIENT_CLINIC_OR_DEPARTMENT_OTHER): Payer: Self-pay | Admitting: Family Medicine

## 2024-05-03 DIAGNOSIS — E119 Type 2 diabetes mellitus without complications: Secondary | ICD-10-CM

## 2024-05-03 DIAGNOSIS — I10 Essential (primary) hypertension: Secondary | ICD-10-CM

## 2024-05-03 LAB — COMPREHENSIVE METABOLIC PANEL WITH GFR
ALT: 47 IU/L — ABNORMAL HIGH (ref 0–44)
AST: 16 IU/L (ref 0–40)
Albumin: 4.7 g/dL (ref 3.9–4.9)
Alkaline Phosphatase: 69 IU/L (ref 44–121)
BUN/Creatinine Ratio: 8 — ABNORMAL LOW (ref 10–24)
BUN: 9 mg/dL (ref 8–27)
Bilirubin Total: 0.5 mg/dL (ref 0.0–1.2)
CO2: 24 mmol/L (ref 20–29)
Calcium: 9.7 mg/dL (ref 8.6–10.2)
Chloride: 103 mmol/L (ref 96–106)
Creatinine, Ser: 1.07 mg/dL (ref 0.76–1.27)
Globulin, Total: 1.8 g/dL (ref 1.5–4.5)
Glucose: 117 mg/dL — ABNORMAL HIGH (ref 70–99)
Potassium: 3.8 mmol/L (ref 3.5–5.2)
Sodium: 142 mmol/L (ref 134–144)
Total Protein: 6.5 g/dL (ref 6.0–8.5)
eGFR: 78 mL/min/1.73 (ref >59)

## 2024-05-03 LAB — CBC WITH DIFFERENTIAL/PLATELET
Basophils Absolute: 0 x10E3/uL (ref 0.0–0.2)
Basos: 1 %
EOS (ABSOLUTE): 0.2 x10E3/uL (ref 0.0–0.4)
Eos: 4 %
Hematocrit: 43.9 % (ref 37.5–51.0)
Hemoglobin: 15.2 g/dL (ref 13.0–17.7)
Immature Grans (Abs): 0 x10E3/uL (ref 0.0–0.1)
Immature Granulocytes: 0 %
Lymphocytes Absolute: 1.3 x10E3/uL (ref 0.7–3.1)
Lymphs: 26 %
MCH: 31.8 pg (ref 26.6–33.0)
MCHC: 34.6 g/dL (ref 31.5–35.7)
MCV: 92 fL (ref 79–97)
Monocytes Absolute: 0.4 x10E3/uL (ref 0.1–0.9)
Monocytes: 7 %
Neutrophils Absolute: 3 x10E3/uL (ref 1.4–7.0)
Neutrophils: 62 %
Platelets: 197 x10E3/uL (ref 150–450)
RBC: 4.78 x10E6/uL (ref 4.14–5.80)
RDW: 11.9 % (ref 11.6–15.4)
WBC: 4.9 x10E3/uL (ref 3.4–10.8)

## 2024-05-03 LAB — HEMOGLOBIN A1C
Est. average glucose Bld gHb Est-mCnc: 111 mg/dL
Hgb A1c MFr Bld: 5.5 % (ref 4.8–5.6)

## 2024-05-06 ENCOUNTER — Ambulatory Visit (HOSPITAL_BASED_OUTPATIENT_CLINIC_OR_DEPARTMENT_OTHER): Admitting: Family Medicine

## 2024-05-06 ENCOUNTER — Encounter (HOSPITAL_BASED_OUTPATIENT_CLINIC_OR_DEPARTMENT_OTHER): Payer: Self-pay | Admitting: Family Medicine

## 2024-05-06 VITALS — BP 146/92 | HR 81 | Ht 74.0 in | Wt 243.5 lb

## 2024-05-06 DIAGNOSIS — I10 Essential (primary) hypertension: Secondary | ICD-10-CM

## 2024-05-06 DIAGNOSIS — Z7984 Long term (current) use of oral hypoglycemic drugs: Secondary | ICD-10-CM

## 2024-05-06 DIAGNOSIS — E119 Type 2 diabetes mellitus without complications: Secondary | ICD-10-CM | POA: Diagnosis not present

## 2024-05-06 NOTE — Assessment & Plan Note (Signed)
 Blood pressure continues to be above goal despite current use of 3 antihypertensive medications at maximal dose.  Patient has not been checking blood pressure at home.  We did previously refer patient to cardiology and he does have appointment scheduled for next week.  Recommend continuing with scheduled appointment with cardiology.  For now we will continue with same medications, no changes made today

## 2024-05-06 NOTE — Patient Instructions (Signed)
  Medication Instructions:  Your physician recommends that you continue on your current medications as directed. Please refer to the Current Medication list given to you today. --If you need a refill on any your medications before your next appointment, please call your pharmacy first. If no refills are authorized on file call the office.--   Follow-Up: Your next appointment:   Your physician recommends that you schedule a follow-up appointment in: 4 months follow up  with Dr. de [place]  You will receive a text message or e-mail with a link to a survey about your care and experience with [place] today! We would greatly appreciate your feedback!   Thanks for letting us be apart of your health journey!!  Primary Care and Sports Medicine   Dr. Ceasar Mons [place]   We encourage you to activate your patient portal called "MyChart".  Sign up information is provided on this After Visit Summary.  MyChart is used to connect with patients for Virtual Visits (Telemedicine).  Patients are able to view lab/test results, encounter notes, upcoming appointments, etc.  Non-urgent messages can be sent to your provider as well. To learn more about what you can do with MyChart, please visit --  [email or web address].

## 2024-05-06 NOTE — Assessment & Plan Note (Signed)
 A1c checked recently did show notable improvement with this now being at 5.5%.  Patient indicates that he has been more mindful of his diet and has also been taking prescribed metformin  more consistently.  With these changes he has also noted small degree of weight loss. Does not have any specific concerns today.  Given notable improvement with A1c, can continue with current management, recommend continuing to focus on lifestyle modifications as well as consistently taking metformin .

## 2024-05-06 NOTE — Progress Notes (Signed)
    Procedures performed today:    None.  Independent interpretation of notes and tests performed by another provider:   None.  Brief History, Exam, Impression, and Recommendations:    BP (!) 146/92 (BP Location: Right Arm, Patient Position: Sitting, Cuff Size: Normal)   Pulse 81   Ht 6' 2 (1.88 m)   Wt 243 lb 8 oz (110.5 kg)   SpO2 96%   BMI 31.26 kg/m   Essential hypertension Assessment & Plan: Blood pressure continues to be above goal despite current use of 3 antihypertensive medications at maximal dose.  Patient has not been checking blood pressure at home.  We did previously refer patient to cardiology and he does have appointment scheduled for next week.  Recommend continuing with scheduled appointment with cardiology.  For now we will continue with same medications, no changes made today   Type 2 diabetes mellitus without complication, without long-term current use of insulin (HCC) Assessment & Plan: A1c checked recently did show notable improvement with this now being at 5.5%.  Patient indicates that he has been more mindful of his diet and has also been taking prescribed metformin  more consistently.  With these changes he has also noted small degree of weight loss. Does not have any specific concerns today.  Given notable improvement with A1c, can continue with current management, recommend continuing to focus on lifestyle modifications as well as consistently taking metformin .   Return in about 4 months (around 09/05/2024) for hypertension, diabetes.   ___________________________________________ Janiece Scovill de [place], MD, ABFM, CAQSM Primary Care and Sports Medicine [facility]

## 2024-05-14 ENCOUNTER — Other Ambulatory Visit (HOSPITAL_BASED_OUTPATIENT_CLINIC_OR_DEPARTMENT_OTHER): Payer: Self-pay

## 2024-05-14 ENCOUNTER — Encounter (HOSPITAL_BASED_OUTPATIENT_CLINIC_OR_DEPARTMENT_OTHER): Payer: Self-pay | Admitting: Cardiology

## 2024-05-14 ENCOUNTER — Ambulatory Visit (HOSPITAL_BASED_OUTPATIENT_CLINIC_OR_DEPARTMENT_OTHER): Admitting: Cardiology

## 2024-05-14 VITALS — BP 154/92 | HR 75 | Ht 74.0 in | Wt 250.6 lb

## 2024-05-14 DIAGNOSIS — E119 Type 2 diabetes mellitus without complications: Secondary | ICD-10-CM

## 2024-05-14 DIAGNOSIS — I10 Essential (primary) hypertension: Secondary | ICD-10-CM | POA: Diagnosis not present

## 2024-05-14 DIAGNOSIS — Z7189 Other specified counseling: Secondary | ICD-10-CM

## 2024-05-14 MED ORDER — SPIRONOLACTONE 25 MG PO TABS
25.0000 mg | ORAL_TABLET | Freq: Every day | ORAL | 3 refills | Status: AC
  Filled 2024-05-14: qty 90, 90d supply, fill #0
  Filled 2024-05-14: qty 30, 30d supply, fill #0
  Filled 2024-08-09: qty 90, 90d supply, fill #1
  Filled 2024-11-04: qty 90, 90d supply, fill #2

## 2024-05-14 NOTE — Progress Notes (Signed)
 Cardiology Office Note:  .   Date:  05/14/2024  ID:  Benjamin Fletcher, DOB 1961/02/06, MRN [id number] PCP: de Peru, Raymond J, MD  [facility] Providers Cardiologist:  Shelda Bruckner, MD {  History of Present Illness: .   Benjamin Fletcher is a 63 y.o. male with PMH hypertension, type II diabetes. He is seen as a new consult today at the request of Dr. De Peru for hypertension  Today: Notes from 01/05/24 and 05/06/24 with Dr. De Peru reviewed. Has continue to have elevated blood pressures despite amlodipine , hydrochlorothiazide , and losartan  at max doses. Referred to cardiology for further evaluation.  Reports that his blood pressure has been elevated as far back as when he was in his 30s. Started medication in his mid-50s. Started losartan , the hydrochlorothiazide , then amlodipine . Doesn't think any of these have helped his BP. Both of his older brother have longstanding hypertension as well.   Checks blood pressure at home, runs upper 140s/80s at home. Has never been well controlled (120s/70s). Has not had issues with dehydration except for when he first started hydrochlorothiazide  years ago.  Has had OSA, on CPAP for ~25 years.  Father had high cholesterol, had 4V CABG. Mother had high blood pressure.   ROS: Denies chest pain, shortness of breath at rest or with normal exertion. No PND, orthopnea, LE edema or unexpected weight gain. No syncope or palpitations. ROS otherwise negative except as noted.   Studies Reviewed: SABRA    EKG:  EKG Interpretation Date/Time:  Friday May 14 2024 15:16:19 EDT Ventricular Rate:  75 PR Interval:  164 QRS Duration:  88 QT Interval:  398 QTC Calculation: 444 R Axis:   -8  Text Interpretation: Normal sinus rhythm Normal ECG Confirmed by Bruckner Shelda [phone]) on 05/14/2024 3:30:56 PM    Physical Exam:   VS:  BP (!) 154/92   Pulse 75   Ht 6' 2 (1.88 m)   Wt 250 lb 9.6 oz (113.7 kg)   SpO2 98%   BMI 32.18 kg/m    Wt  Readings from Last 3 Encounters:  05/14/24 250 lb 9.6 oz (113.7 kg)  05/06/24 243 lb 8 oz (110.5 kg)  01/05/24 261 lb 4.8 oz (118.5 kg)    GEN: Well nourished, well developed in no acute distress HEENT: Normal, moist mucous membranes NECK: No JVD CARDIAC: regular rhythm, normal S1 and S2, no rubs or gallops. No murmur. VASCULAR: Radial and DP pulses 2+ bilaterally. No carotid bruits RESPIRATORY:  Clear to auscultation without rales, wheezing or rhonchi  ABDOMEN: Soft, non-tender, non-distended MUSCULOSKELETAL:  Ambulates independently SKIN: Warm and dry, no edema NEUROLOGIC:  Alert and oriented x 3. No focal neuro deficits noted. PSYCHIATRIC:  Normal affect    ASSESSMENT AND PLAN: .    Hypertension -has OSA, on CPAP -add spironolactone  today, check BMET in 2-3 weeks -next visit, would consider changing losartan  to more potent ARB, then hydrochlorothiazide  to chlorthalidone -after that, would consider carvedilol  Type II diabetes -most recent A1c 5.5, but was 10.3 in April -discussed recommendations for aspirin and statin use in type II diabetes. -no recent lipids, get with BMET as above  CV risk counseling and prevention -recommend heart healthy/Mediterranean diet, with whole grains, fruits, vegetable, fish, lean meats, nuts, and olive oil. Limit salt. -recommend moderate walking, 3-5 times/week for 30-50 minutes each session. Aim for at least 150 minutes.week. Goal should be pace of 3 miles/hours, or walking 1.5 miles in 30 minutes -recommend avoidance of tobacco products.  Avoid excess alcohol.  Dispo: 6 weeks with APP, then 3 mos with me  Signed, Shelda Bruckner, MD   Shelda Bruckner, MD, PhD, [facility] [place]  [facility]  [place]  [facility] at [facility] at [facility] [address] [place], [place] [address] [phone]

## 2024-05-14 NOTE — Patient Instructions (Signed)
 Medication Instructions:  Your physician has recommended you make the following change in your medication:  1-START Spirolactone 25 mg by mouth daily.  *If you need a refill on your cardiac medications before your next appointment, please call your pharmacy*  Lab Work: Your physician recommends that you return for lab work in: 2 to 3 weeks for fasting BMET and Lipid panel  If you have labs (blood work) drawn today and your tests are completely normal, you will receive your results only by: MyChart Message (if you have MyChart) OR A paper copy in the mail If you have any lab test that is abnormal or we need to change your treatment, we will call you to review the results.  Follow-Up: At [facility], you and your health needs are our priority.  As part of our continuing mission to provide you with exceptional heart care, our providers are all part of one team.  This team includes your primary Cardiologist (physician) and Advanced Practice Providers or APPs (Physician Assistants and Nurse Practitioners) who all work together to provide you with the care you need, when you need it.  Your next appointment:   6 week(s)  Provider:   Rosaline Bane, NP or Reche Finder, NP    We recommend signing up for the patient portal called MyChart.  Sign up information is provided on this After Visit Summary.  MyChart is used to connect with patients for Virtual Visits (Telemedicine).  Patients are able to view lab/test results, encounter notes, upcoming appointments, etc.  Non-urgent messages can be sent to your provider as well.   To learn more about what you can do with MyChart, go to [email or web address].   Your physician recommends that you schedule a follow-up appointment in: 6 months with Dr. Lonni

## 2024-06-17 DIAGNOSIS — Z7189 Other specified counseling: Secondary | ICD-10-CM | POA: Diagnosis not present

## 2024-06-17 DIAGNOSIS — E119 Type 2 diabetes mellitus without complications: Secondary | ICD-10-CM | POA: Diagnosis not present

## 2024-06-18 LAB — BASIC METABOLIC PANEL WITH GFR
BUN/Creatinine Ratio: 18 (ref 10–24)
BUN: 19 mg/dL (ref 8–27)
CO2: 24 mmol/L (ref 20–29)
Calcium: 9.6 mg/dL (ref 8.6–10.2)
Chloride: 98 mmol/L (ref 96–106)
Creatinine, Ser: 1.08 mg/dL (ref 0.76–1.27)
Glucose: 135 mg/dL — ABNORMAL HIGH (ref 70–99)
Potassium: 4 mmol/L (ref 3.5–5.2)
Sodium: 137 mmol/L (ref 134–144)
eGFR: 78 mL/min/1.73 (ref >59)

## 2024-06-18 LAB — LIPID PANEL
Chol/HDL Ratio: 3.3 ratio (ref 0.0–5.0)
Cholesterol, Total: 224 mg/dL — ABNORMAL HIGH (ref 100–199)
HDL: 68 mg/dL (ref >39)
LDL Chol Calc (NIH): 136 mg/dL — ABNORMAL HIGH (ref 0–99)
Triglycerides: 116 mg/dL (ref 0–149)
VLDL Cholesterol Cal: 20 mg/dL (ref 5–40)

## 2024-06-24 NOTE — Progress Notes (Signed)
 KERMAN PFOST                                          MRN: [id number]   06/24/2024   The VBCI Quality Team Specialist reviewed this patient medical record for the purposes of chart review for care gap closure. The following were reviewed: chart review for care gap closure-controlling blood pressure.    VBCI Quality Team

## 2024-06-29 ENCOUNTER — Other Ambulatory Visit (HOSPITAL_BASED_OUTPATIENT_CLINIC_OR_DEPARTMENT_OTHER): Payer: Self-pay

## 2024-06-29 ENCOUNTER — Encounter (HOSPITAL_BASED_OUTPATIENT_CLINIC_OR_DEPARTMENT_OTHER): Payer: Self-pay | Admitting: Family

## 2024-06-29 ENCOUNTER — Ambulatory Visit (INDEPENDENT_AMBULATORY_CARE_PROVIDER_SITE_OTHER): Admitting: Family

## 2024-06-29 VITALS — BP 120/80 | HR 72 | Ht 74.0 in | Wt 258.0 lb

## 2024-06-29 DIAGNOSIS — G4733 Obstructive sleep apnea (adult) (pediatric): Secondary | ICD-10-CM

## 2024-06-29 DIAGNOSIS — E782 Mixed hyperlipidemia: Secondary | ICD-10-CM

## 2024-06-29 DIAGNOSIS — I10 Essential (primary) hypertension: Secondary | ICD-10-CM | POA: Diagnosis not present

## 2024-06-29 MED ORDER — LOSARTAN POTASSIUM-HCTZ 100-25 MG PO TABS
1.0000 | ORAL_TABLET | Freq: Every day | ORAL | 3 refills | Status: AC
  Filled 2024-06-29: qty 90, 90d supply, fill #0
  Filled 2024-09-22 (×2): qty 90, 90d supply, fill #1

## 2024-06-29 NOTE — Progress Notes (Signed)
  Cardiology Office Note   Date:  06/29/2024  ID:  Benjamin Fletcher, DOB [birth year], MRN [id number] PCP: de Peru, Raymond J, MD  [facility] Providers Cardiologist:  Shelda Bruckner, MD     History of Present Illness Benjamin Fletcher is a 63 y.o. male with hx of DM2, HLD, HTN.  Established with Dr. Bruckner for poorly controlled BP. Spironolactone  25mg  daily added.   Presents today for follow up. Reports feeling calmer since last seen. Tolerating spironolactone  without issues. Not monitoring BP at home. 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 Reports no shortness of breath nor dyspnea on exertion. Reports no chest pain, pressure, or tightness. No edema, orthopnea, PND. Reports no palpitations.    ROS: Please see the history of present illness.    All other systems reviewed and are negative.   Studies Reviewed          Risk Assessment/Calculations           Physical Exam VS:  BP 120/80 (BP Location: Left Arm, Patient Position: Sitting, Cuff Size: Normal)   Pulse 72   Ht 6' 2 (1.88 m)   Wt 258 lb (117 kg)   SpO2 96%   BMI 33.13 kg/m        Wt Readings from Last 3 Encounters:  06/29/24 258 lb (117 kg)  05/14/24 250 lb 9.6 oz (113.7 kg)  05/06/24 243 lb 8 oz (110.5 kg)    GEN: Well nourished, well developed in no acute distress NECK: No JVD; No carotid bruits CARDIAC: RRR, no murmurs, rubs, gallops RESPIRATORY:  Clear to auscultation without rales, wheezing or rhonchi  ABDOMEN: Soft, non-tender, non-distended EXTREMITIES:  No edema; No deformity   ASSESSMENT AND PLAN  HTN -BP at goal less than 130/80.  For easier adherence transition losartan  and HCTZ to combination tablet losartan -HCTZ 100-25 mg daily.  Continue Amlodipine  10 mg daily, spironolactone  25 mg daily. Discussed to monitor BP at home at least 2 hours after medications and sitting for 5-10 minutes. Recommend aiming for 150 minutes of moderate intensity activity per week and  following a heart healthy diet.    DM2 - Continue to follow with PCP.   OSA - CPAP compliance encouraged.   HLD -06/14/2024 total cholesterol 224, TG 116, HDL 68, LDL 136.  He is hesitant regarding cholesterol-lowering medication. Discussed indication for statin in diabetes.  Wishes to work on lifestyle changes.  Educational handouts provided.  Plan for lifestyle change for 3 months.  Repeat lipid panel in 3 months and if LDL not improved to goal less than 100 consider initiation of statin.         Dispo: follow up in 4 months  Signed, Reche GORMAN Finder, NP

## 2024-06-29 NOTE — Patient Instructions (Addendum)
 Medication Instructions:    START Losartan -hydrochlorothiazide  one (1) tablet by mouth ( 100-25 mg) daily.   *If you need a refill on your cardiac medications before your next appointment, please call your pharmacy*  Lab Work:  Your physician recommends that you return for a FASTING lipid profile in 3 months fasting after midnight. Patient given paperwork today.    If you have labs (blood work) drawn today and your tests are completely normal, you will receive your results only by: MyChart Message (if you have MyChart) OR A paper copy in the mail If you have any lab test that is abnormal or we need to change your treatment, we will call you to review the results.  Testing/Procedures:  None ordered.  Follow-Up: At [facility], you and your health needs are our priority.  As part of our continuing mission to provide you with exceptional heart care, our providers are all part of one team.  This team includes your primary Cardiologist (physician) and Advanced Practice Providers or APPs (Physician Assistants and Nurse Practitioners) who all work together to provide you with the care you need, when you need it.  Your next appointment:   4 month(s)  Provider:   Shelda Bruckner, MD    We recommend signing up for the patient portal called MyChart.  Sign up information is provided on this After Visit Summary.  MyChart is used to connect with patients for Virtual Visits (Telemedicine).  Patients are able to view lab/test results, encounter notes, upcoming appointments, etc.  Non-urgent messages can be sent to your provider as well.   To learn more about what you can do with MyChart, go to [email or web address].   Other Instructions  Your physician wants you to follow-up in: 4 months.  You will receive a reminder letter in the mail two months in advance. If you don't receive a letter, please call our office to schedule the follow-up appointment.     Heart Healthy Diet  Recommendations: A low-salt diet is recommended. Meats should be grilled, baked, or boiled. Avoid fried foods. Focus on lean protein sources like fish or chicken with vegetables and fruits. The American Heart Association is a Chief Technology Officer!  American Heart Association Diet and Lifeystyle Recommendations    Exercise recommendations: The American Heart Association recommends 150 minutes of moderate intensity exercise weekly. Try 30 minutes of moderate intensity exercise 4-5 times per week. This could include walking, jogging, or swimming.

## 2024-07-23 ENCOUNTER — Ambulatory Visit (HOSPITAL_BASED_OUTPATIENT_CLINIC_OR_DEPARTMENT_OTHER): Payer: Self-pay | Admitting: Cardiology

## 2024-07-30 ENCOUNTER — Other Ambulatory Visit (HOSPITAL_BASED_OUTPATIENT_CLINIC_OR_DEPARTMENT_OTHER): Payer: Self-pay

## 2024-07-30 MED ORDER — FLUZONE 0.5 ML IM SUSY
0.5000 mL | PREFILLED_SYRINGE | Freq: Once | INTRAMUSCULAR | 0 refills | Status: AC
  Filled 2024-07-30: qty 0.5, 1d supply, fill #0

## 2024-08-09 ENCOUNTER — Other Ambulatory Visit (HOSPITAL_BASED_OUTPATIENT_CLINIC_OR_DEPARTMENT_OTHER): Payer: Self-pay

## 2024-08-10 ENCOUNTER — Other Ambulatory Visit (HOSPITAL_BASED_OUTPATIENT_CLINIC_OR_DEPARTMENT_OTHER): Payer: Self-pay | Admitting: Family Medicine

## 2024-08-11 ENCOUNTER — Other Ambulatory Visit (HOSPITAL_BASED_OUTPATIENT_CLINIC_OR_DEPARTMENT_OTHER): Payer: Self-pay

## 2024-08-11 MED ORDER — AMLODIPINE BESYLATE 10 MG PO TABS
10.0000 mg | ORAL_TABLET | Freq: Every day | ORAL | 1 refills | Status: AC
  Filled 2024-08-11: qty 90, 90d supply, fill #0
  Filled 2024-11-04: qty 90, 90d supply, fill #1

## 2024-08-30 ENCOUNTER — Ambulatory Visit (INDEPENDENT_AMBULATORY_CARE_PROVIDER_SITE_OTHER)

## 2024-08-30 ENCOUNTER — Ambulatory Visit (HOSPITAL_BASED_OUTPATIENT_CLINIC_OR_DEPARTMENT_OTHER): Admitting: Student

## 2024-08-30 ENCOUNTER — Other Ambulatory Visit (HOSPITAL_BASED_OUTPATIENT_CLINIC_OR_DEPARTMENT_OTHER): Payer: Self-pay

## 2024-08-30 DIAGNOSIS — M25552 Pain in left hip: Secondary | ICD-10-CM | POA: Diagnosis not present

## 2024-08-30 DIAGNOSIS — M87051 Idiopathic aseptic necrosis of right femur: Secondary | ICD-10-CM | POA: Diagnosis not present

## 2024-08-30 DIAGNOSIS — M5442 Lumbago with sciatica, left side: Secondary | ICD-10-CM | POA: Diagnosis not present

## 2024-08-30 DIAGNOSIS — M47816 Spondylosis without myelopathy or radiculopathy, lumbar region: Secondary | ICD-10-CM | POA: Diagnosis not present

## 2024-08-30 DIAGNOSIS — M87052 Idiopathic aseptic necrosis of left femur: Secondary | ICD-10-CM | POA: Diagnosis not present

## 2024-08-30 DIAGNOSIS — M5126 Other intervertebral disc displacement, lumbar region: Secondary | ICD-10-CM | POA: Diagnosis not present

## 2024-08-30 MED ORDER — METHYLPREDNISOLONE 4 MG PO TBPK
ORAL_TABLET | ORAL | 0 refills | Status: AC
  Filled 2024-08-30: qty 21, 6d supply, fill #0

## 2024-08-30 MED ORDER — METHOCARBAMOL 500 MG PO TABS
500.0000 mg | ORAL_TABLET | Freq: Four times a day (QID) | ORAL | 0 refills | Status: AC
  Filled 2024-08-30: qty 40, 10d supply, fill #0

## 2024-08-30 NOTE — Progress Notes (Signed)
 Chief Complaint: Left hip pain     History of Present Illness:    Benjamin Fletcher is a 63 y.o. male who presents today for evaluation of pain in his left hip and leg.  Patient states that his symptoms began about 5 days ago with no known injury however he was working on a Probation Officer which required repetitive motions.  His pain levels had increased the next day, and have become severe by the following day.  States that pain is mainly in the posterior left hip and radiates down the posterior and anterior thigh to the knee.  He does note some numbness and tingling in the thigh as well as the plantar aspect of the foot.  He has been taking ibuprofen without any significant relief.   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   Sleep apnea    Past Surgical History:  Procedure Laterality Date   PROSTATE BIOPSY     skin cancer excised x3     TONSILLECTOMY     Social History   Socioeconomic History   Marital status: Divorced    Spouse name: Not on file   Number of children: Not on file   Years of education: Not on file   Highest education level: Not on file  Occupational History   Occupation: HVAC  Tobacco Use   Smoking status: Former    Types: Cigarettes    Passive exposure: Past   Smokeless tobacco: Never  Vaping Use   Vaping status: Never Used  Substance and Sexual Activity   Alcohol use: Yes    Alcohol/week: 12.0 standard drinks of alcohol    Types: 12 Cans of beer per week    Comment: weekends only   Drug use: No   Sexual activity: Yes  Other Topics Concern   Not on file  Social History Narrative   Not on file   Social Drivers of Health   Financial Resource Strain: Low Risk  (06/12/2023)    Overall Financial Resource Strain (CARDIA)    Difficulty of Paying Living Expenses: Not hard at all  Food Insecurity: No Food Insecurity (06/12/2023)   Hunger Vital Sign    Worried About Running Out of Food in the Last Year: Never true    Ran Out of Food in the Last Year: Never true  Transportation Needs: No Transportation Needs (06/12/2023)   PRAPARE - Administrator, Civil Service (Medical): No    Lack of Transportation (Non-Medical): No  Physical Activity: Sufficiently Active (06/12/2023)   Exercise Vital Sign    Days of Exercise per Week: 5 days    Minutes of Exercise per Session: 60 min  Stress: No Stress Concern Present (06/12/2023)   [employer] of Occupational Health - Occupational Stress Questionnaire    Feeling of Stress : Not at all  Social Connections: Socially Isolated (06/12/2023)   Social Connection and Isolation Panel    Frequency of Communication with Friends and Family: More than three times a week    Frequency of Social Gatherings with Friends and  Family: More than three times a week    Attends Religious Services: Never    Active Member of Clubs or Organizations: No    Attends Engineer, Structural: Never    Marital Status: Divorced   Family History  Problem Relation Age of Onset   Heart disease Father        CABG   Cancer Father        prostate   Heart attack Father    Hyperlipidemia Father    Hypertension Mother    Cancer Brother        kidney and prostate   Hyperlipidemia Brother    Diabetes Brother    Hyperlipidemia Brother    Kidney disease Brother    No Known Allergies Current Outpatient Medications  Medication Sig Dispense Refill   methocarbamol (ROBAXIN) 500 MG tablet Take 1 tablet (500 mg total) by mouth 4 (four) times daily for 10 days. 40 tablet 0   methylPREDNISolone (MEDROL DOSEPAK) 4 MG TBPK tablet Take per packet instructions 21 each 0   amLODipine  (NORVASC ) 10 MG tablet Take 1 tablet (10 mg total) by mouth daily. 90  tablet 1   losartan -hydrochlorothiazide  (HYZAAR ) 100-25 MG tablet Take 1 tablet by mouth daily. 90 tablet 3   metFORMIN  (GLUCOPHAGE ) 500 MG tablet TAKE 1 TABLET BY MOUTH TWICE A DAY WITH FOOD 180 tablet 3   spironolactone  (ALDACTONE ) 25 MG tablet Take 1 tablet (25 mg total) by mouth daily. 90 tablet 3   No current facility-administered medications for this visit.   No results found.  Review of Systems:   A ROS was performed including pertinent positives and negatives as documented in the HPI.  Physical Exam :   Constitutional: NAD and appears stated age Neurological: Alert and oriented Psych: Appropriate affect and cooperative There were no vitals taken for this visit.   Comprehensive Musculoskeletal Exam:    No significant midline tenderness of the lumbar spine.  There is some left sided paraspinal tenderness extending into the posterior hip.  No tenderness over the greater trochanter.  Fluid passive left hip range of motion 110 degrees flexion and 20 degrees of internal and external rotation without significant discomfort.  Bilateral knee flexion/extension ankle dorsiflexion/plantarflexion strength is 5/5.  Imaging:   Xray (lumbar spine 4 views): Negative for acute bony abnormality.  Multilevel anterior osteophyte formation with overall well-preserved disc spacing.  No significant listhesis present.  There is noted lower facet hypertrophy.  Xray (AP pelvis, left hip 3 views): Sclerosis and lucencies within bilateral femoral heads consistent with known AVN.  No other evidence of acute bony abnormality.   I personally reviewed and interpreted the radiographs.   Assessment:   63 y.o. male with 5-day history of left sided low back and hip pain.  Pain does radiate mainly posteriorly into the thigh but does not extend past the knee.  He is experiencing some paresthesias but demonstrates no significant lower extremity weakness on exam.  Symptoms do appear most consistent with sciatica  although patient does have known history of bilateral hip AVN.  No groin pain and hip motion does not cause discomfort.  Discussed conservative treatment options including medication and physical therapy.  Will prescribe short course of oral steroids and have him discontinue ibuprofen.  Will also send in some Robaxin for symptom relief.  Patient will update via MyChart or return to clinic if symptoms persist.  Plan :    - Start Medrol Dosepak and methocarbamol 500 mg as needed - Follow-up  if symptoms worsen or persist to consider physical therapy referral or MRI if needed     I personally saw and evaluated the patient, and participated in the management and treatment plan.  Leonce Reveal, PA-C Orthopedics

## 2024-09-08 ENCOUNTER — Ambulatory Visit (HOSPITAL_BASED_OUTPATIENT_CLINIC_OR_DEPARTMENT_OTHER): Admitting: Family Medicine

## 2024-09-09 ENCOUNTER — Encounter (HOSPITAL_BASED_OUTPATIENT_CLINIC_OR_DEPARTMENT_OTHER): Payer: Self-pay | Admitting: Cardiology

## 2024-09-22 ENCOUNTER — Other Ambulatory Visit (HOSPITAL_BASED_OUTPATIENT_CLINIC_OR_DEPARTMENT_OTHER): Payer: Self-pay

## 2024-09-22 ENCOUNTER — Other Ambulatory Visit: Payer: Self-pay

## 2024-09-24 ENCOUNTER — Other Ambulatory Visit: Payer: Self-pay

## 2024-10-22 ENCOUNTER — Other Ambulatory Visit (HOSPITAL_BASED_OUTPATIENT_CLINIC_OR_DEPARTMENT_OTHER): Payer: Self-pay | Admitting: Family Medicine

## 2024-10-22 DIAGNOSIS — E119 Type 2 diabetes mellitus without complications: Secondary | ICD-10-CM

## 2024-10-22 DIAGNOSIS — I10 Essential (primary) hypertension: Secondary | ICD-10-CM

## 2024-11-05 ENCOUNTER — Other Ambulatory Visit: Payer: Self-pay
